# Patient Record
Sex: Female | Born: 1975
Health system: Southern US, Community
[De-identification: ages and names within clinical notes are randomized; demographics above are authoritative.]

## PROBLEM LIST (undated history)

## (undated) ENCOUNTER — Inpatient Hospital Stay: Payer: Self-pay

---

## 2014-01-18 ENCOUNTER — Inpatient Hospital Stay: Payer: Self-pay | Admitting: Obstetrics and Gynecology

## 2014-01-18 LAB — COMPREHENSIVE METABOLIC PANEL
ANION GAP: 6 — AB (ref 7–16)
Albumin: 2.2 g/dL — ABNORMAL LOW (ref 3.4–5.0)
Alkaline Phosphatase: 217 U/L — ABNORMAL HIGH
BUN: 4 mg/dL — ABNORMAL LOW (ref 7–18)
Bilirubin,Total: 0.5 mg/dL (ref 0.2–1.0)
CHLORIDE: 110 mmol/L — AB (ref 98–107)
CREATININE: 0.68 mg/dL (ref 0.60–1.30)
Calcium, Total: 7.7 mg/dL — ABNORMAL LOW (ref 8.5–10.1)
Co2: 25 mmol/L (ref 21–32)
EGFR (Non-African Amer.): >60
Glucose: 70 mg/dL (ref 65–99)
Osmolality: 277 (ref 275–301)
POTASSIUM: 3.2 mmol/L — AB (ref 3.5–5.1)
SGOT(AST): 26 U/L (ref 15–37)
SGPT (ALT): 15 U/L
SODIUM: 141 mmol/L (ref 136–145)
TOTAL PROTEIN: 5.9 g/dL — AB (ref 6.4–8.2)

## 2014-01-18 LAB — CBC WITH DIFFERENTIAL/PLATELET
Basophil #: 0.1 10*3/uL (ref 0.0–0.1)
Basophil %: 0.6 %
Eosinophil #: 0 10*3/uL (ref 0.0–0.7)
Eosinophil %: 0.4 %
HCT: 30.4 % — ABNORMAL LOW (ref 35.0–47.0)
HGB: 9.9 g/dL — ABNORMAL LOW (ref 12.0–16.0)
LYMPHS ABS: 2 10*3/uL (ref 1.0–3.6)
Lymphocyte %: 18.6 %
MCH: 27 pg (ref 26.0–34.0)
MCHC: 32.4 g/dL (ref 32.0–36.0)
MCV: 83 fL (ref 80–100)
Monocyte #: 0.5 x10 3/mm (ref 0.2–0.9)
Monocyte %: 4.8 %
NEUTROS ABS: 8 10*3/uL — AB (ref 1.4–6.5)
Neutrophil %: 75.6 %
Platelet: 185 10*3/uL (ref 150–440)
RBC: 3.65 10*6/uL — ABNORMAL LOW (ref 3.80–5.20)
RDW: 14.6 % — ABNORMAL HIGH (ref 11.5–14.5)
WBC: 10.6 10*3/uL (ref 3.6–11.0)

## 2014-01-19 LAB — CBC WITH DIFFERENTIAL/PLATELET
Basophil #: 0.1 10*3/uL (ref 0.0–0.1)
Basophil %: 0.6 %
EOS ABS: 0.1 10*3/uL (ref 0.0–0.7)
Eosinophil %: 0.7 %
HCT: 31.1 % — ABNORMAL LOW (ref 35.0–47.0)
HGB: 9.9 g/dL — ABNORMAL LOW (ref 12.0–16.0)
Lymphocyte #: 1.8 10*3/uL (ref 1.0–3.6)
Lymphocyte %: 13.3 %
MCH: 26.8 pg (ref 26.0–34.0)
MCHC: 32 g/dL (ref 32.0–36.0)
MCV: 84 fL (ref 80–100)
Monocyte #: 0.8 x10 3/mm (ref 0.2–0.9)
Monocyte %: 5.7 %
NEUTROS ABS: 10.7 10*3/uL — AB (ref 1.4–6.5)
NEUTROS PCT: 79.7 %
Platelet: 172 10*3/uL (ref 150–440)
RBC: 3.71 10*6/uL — AB (ref 3.80–5.20)
RDW: 14.3 % (ref 11.5–14.5)
WBC: 13.4 10*3/uL — AB (ref 3.6–11.0)

## 2014-01-19 LAB — COMPREHENSIVE METABOLIC PANEL
ALBUMIN: 2 g/dL — AB (ref 3.4–5.0)
ALK PHOS: 181 U/L — AB
ALT: 19 U/L
Anion Gap: 6 — ABNORMAL LOW (ref 7–16)
BILIRUBIN TOTAL: 0.4 mg/dL (ref 0.2–1.0)
BUN: 2 mg/dL — AB (ref 7–18)
CO2: 25 mmol/L (ref 21–32)
Calcium, Total: 7.6 mg/dL — ABNORMAL LOW (ref 8.5–10.1)
Chloride: 110 mmol/L — ABNORMAL HIGH (ref 98–107)
Creatinine: 0.58 mg/dL — ABNORMAL LOW (ref 0.60–1.30)
GLUCOSE: 83 mg/dL (ref 65–99)
Osmolality: 277 (ref 275–301)
Potassium: 3.7 mmol/L (ref 3.5–5.1)
SGOT(AST): 33 U/L (ref 15–37)
Sodium: 141 mmol/L (ref 136–145)
Total Protein: 5.6 g/dL — ABNORMAL LOW (ref 6.4–8.2)

## 2014-01-19 LAB — HEMATOCRIT: HCT: 28.4 % — ABNORMAL LOW (ref 35.0–47.0)

## 2014-07-04 NOTE — H&P (Signed)
L&D Evaluation:  History:  HPI -CC: regular UCs -HPI: 39 y/o [id number] @ 39/1 with above CC. Preg c/b AMA (declined genetic screening except normal anatomy scan), GBS pos, h/o G2 PPH  Minimal VB discharge, no LOF or decreased FM  Pt denies any s/s of pre-x   Allergies NKDA   Exam:  Vital Signs AF mildly elevated BPs (one 170s on admit). all other VS normal and stable most recent BP 140s/ 102    General no apparent distress   Mental Status clear    Chest clear    Heart rrr no mrgs   Abdomen gravid, non-tender, 3500gm, cephalic   Reflexes 2+  brachial    Pelvic 4-5 at noon per RN   Mebranes Intact   FHT category I   Ucx regular, q1-676m   Skin dry   Other 2+ b/l LE edema symmetric   Impression:  Impression active term labor, gHTN vs pre- x w/o severe features   Plan:  Comments *IUP: category I tracing. fetal status reassuring *GBS pos: 1st dose of amp going in now *active term labor[facility] after delivery. type and screen ordered *gHTN: f/u pre-eclampsia labs and pt told to let us know for any severe s/s.   anticipate SVD   Electronic Signatures: [place] BingPickens, Lindsay Fitzgerald (MD)  (Signed 25-Nov-15 13:31)  Authored: L&D Evaluation   Last Updated: 25-Nov-15 13:31 by [place] BingPickens, Destiny Hagin (MD)

## 2015-01-25 LAB — OB RESULTS CONSOLE HEPATITIS B SURFACE ANTIGEN: Hepatitis B Surface Ag: NEGATIVE

## 2015-01-25 LAB — OB RESULTS CONSOLE RPR: RPR: NONREACTIVE

## 2015-01-25 LAB — OB RESULTS CONSOLE HIV ANTIBODY (ROUTINE TESTING): HIV: NONREACTIVE

## 2015-01-25 LAB — OB RESULTS CONSOLE VARICELLA ZOSTER ANTIBODY, IGG: Varicella: IMMUNE

## 2015-01-25 LAB — OB RESULTS CONSOLE RUBELLA ANTIBODY, IGM: Rubella: IMMUNE

## 2015-02-25 NOTE — L&D Delivery Note (Signed)
Delivery Note  EGA 38.6   At 7:09 AM a viable female was delivered via Vaginal, Spontaneous Delivery (Presentation: ROA).  APGAR: 8, 9; weight  .   Placenta status: Intact, Spontaneous.  Cord:  with the following complications: none apparent.  Cord pH: not collected.  Anesthesia: None  Episiotomy: None Lacerations:  None Suture Repair: N/A Est. Blood Loss (mL):  200cc  Mom to postpartum.  Baby to Couplet care / Skin to Skin.  Mom presented with worsening contractions and history of precipitous delivery.  She was observed without cervical change for several hours, given her antibiotics for GBS ppx and then given cytotec x2 for cervical ripening.  At 6am she was checked and 4cm, with stronger contractions.  She quickly progressed to 6-7cm and was AROM'd for clear fluid.  She was complete and began pushing within 15 minutes.  She delivered a viable female without difficulty, which was placed immediately on her chest.  Cord was kept intact until pulseless, and then clamped and cut by FOB. Cord blood was collected. Placenta delivered spontaneously, and intact.  No lacerations. We sang happy birthday to the as-of-yet-unnamed beautiful baby boy.    Jayel Inks C Tee Richeson 08/05/2015, 7:33 AM

## 2015-07-20 LAB — OB RESULTS CONSOLE GBS: STREP GROUP B AG: POSITIVE

## 2015-07-23 ENCOUNTER — Encounter: Payer: Self-pay | Admitting: Obstetrics & Gynecology

## 2015-07-23 ENCOUNTER — Inpatient Hospital Stay
Admission: EM | Admit: 2015-07-23 | Discharge: 2015-07-24 | DRG: 778 | Disposition: A | Discharge status: Home / Self Care | Payer: 59 | Attending: Obstetrics & Gynecology | Admitting: Obstetrics & Gynecology

## 2015-07-23 DIAGNOSIS — O09523 Supervision of elderly multigravida, third trimester: Secondary | ICD-10-CM

## 2015-07-23 DIAGNOSIS — O99820 Streptococcus B carrier state complicating pregnancy: Secondary | ICD-10-CM | POA: Diagnosis present

## 2015-07-23 DIAGNOSIS — Z3A36 36 weeks gestation of pregnancy: Secondary | ICD-10-CM

## 2015-07-23 LAB — TYPE AND SCREEN
ABO/RH(D): O POS
ANTIBODY SCREEN: NEGATIVE

## 2015-07-23 LAB — ABO/RH: ABO/RH(D): O POS

## 2015-07-23 LAB — CBC
HCT: 32.4 % — ABNORMAL LOW (ref 35.0–47.0)
HEMOGLOBIN: 10.7 g/dL — AB (ref 12.0–16.0)
MCH: 27.3 pg (ref 26.0–34.0)
MCHC: 33.1 g/dL (ref 32.0–36.0)
MCV: 82.4 fL (ref 80.0–100.0)
PLATELETS: 215 10*3/uL (ref 150–440)
RBC: 3.93 MIL/uL (ref 3.80–5.20)
RDW: 14.2 % (ref 11.5–14.5)
WBC: 10.7 10*3/uL (ref 3.6–11.0)

## 2015-07-23 MED ORDER — BETAMETHASONE SOD PHOS & ACET 6 (3-3) MG/ML IJ SUSP
12.0000 mg | INTRAMUSCULAR | Status: DC
Start: 2015-07-23 — End: 2015-07-24
  Administered 2015-07-23: 12 mg via INTRAMUSCULAR

## 2015-07-23 MED ORDER — LACTATED RINGERS IV SOLN: 500.0000 mL | INTRAVENOUS | Status: DC | PRN

## 2015-07-23 MED ORDER — DEXTROSE 5 % IV SOLN
5.0000 10*6.[IU] | Freq: Once | INTRAVENOUS | Status: AC
  Administered 2015-07-23: 5 10*6.[IU] via INTRAVENOUS
  Filled 2015-07-23: qty 5

## 2015-07-23 MED ORDER — BETAMETHASONE SOD PHOS & ACET 6 (3-3) MG/ML IJ SUSP
INTRAMUSCULAR | Status: AC
  Administered 2015-07-23: 12 mg via INTRAMUSCULAR
  Filled 2015-07-23: qty 1

## 2015-07-23 MED ORDER — PENICILLIN G POTASSIUM 5000000 UNITS IJ SOLR
2.5000 10*6.[IU] | INTRAVENOUS | Status: DC
  Administered 2015-07-24: 2.5 10*6.[IU] via INTRAVENOUS
  Filled 2015-07-23 (×7): qty 2.5

## 2015-07-23 MED ORDER — LACTATED RINGERS IV SOLN
INTRAVENOUS | Status: DC
  Administered 2015-07-23: 19:00:00 via INTRAVENOUS

## 2015-07-23 MED ORDER — LIDOCAINE HCL (PF) 1 % IJ SOLN
INTRAMUSCULAR | Status: AC
  Filled 2015-07-23: qty 30

## 2015-07-23 MED ORDER — SOD CITRATE-CITRIC ACID 500-334 MG/5ML PO SOLN: 30.0000 mL | ORAL | Status: DC | PRN

## 2015-07-23 MED ORDER — OXYTOCIN 40 UNITS IN LACTATED RINGERS INFUSION - SIMPLE MED: 2.5000 [IU]/h | INTRAVENOUS | Status: DC

## 2015-07-23 MED ORDER — BUTORPHANOL TARTRATE 1 MG/ML IJ SOLN: 1.0000 mg | INTRAMUSCULAR | Status: DC | PRN

## 2015-07-23 MED ORDER — PROCHLORPERAZINE EDISYLATE 5 MG/ML IJ SOLN
10.0000 mg | Freq: Once | INTRAMUSCULAR | Status: AC
  Administered 2015-07-23: 10 mg via INTRAVENOUS
  Filled 2015-07-23 (×2): qty 2

## 2015-07-23 MED ORDER — MORPHINE SULFATE (PF) 10 MG/ML IV SOLN
10.0000 mg | Freq: Once | INTRAVENOUS | Status: AC
  Administered 2015-07-23: 10 mg via INTRAVENOUS
  Filled 2015-07-23: qty 1

## 2015-07-23 MED ORDER — ONDANSETRON HCL 4 MG/2ML IJ SOLN: 4.0000 mg | Freq: Four times a day (QID) | INTRAMUSCULAR | Status: DC | PRN

## 2015-07-23 MED ORDER — ACETAMINOPHEN 325 MG PO TABS: 650.0000 mg | ORAL_TABLET | ORAL | Status: DC | PRN

## 2015-07-23 MED ORDER — OXYTOCIN 10 UNIT/ML IJ SOLN
INTRAMUSCULAR | Status: AC
  Filled 2015-07-23: qty 2

## 2015-07-23 MED ORDER — OXYTOCIN BOLUS FROM INFUSION: 500.0000 mL | INTRAVENOUS | Status: DC

## 2015-07-23 MED ORDER — LIDOCAINE HCL (PF) 1 % IJ SOLN: 30.0000 mL | INTRAMUSCULAR | Status: DC | PRN

## 2015-07-23 MED ORDER — AMMONIA AROMATIC IN INHA
RESPIRATORY_TRACT | Status: AC
  Filled 2015-07-23: qty 10

## 2015-07-23 MED ORDER — MISOPROSTOL 200 MCG PO TABS
ORAL_TABLET | ORAL | Status: AC
  Filled 2015-07-23: qty 4

## 2015-07-23 NOTE — Plan of Care (Signed)
Discussed with pt that we had spoken to dr ward. Ambulation encouraged. Portable monitors applied. Unable to determine presenting part on cervical exam.

## 2015-07-23 NOTE — Plan of Care (Signed)
Dr ward here. Presentation verified. Orders received

## 2015-07-23 NOTE — Progress Notes (Signed)
Intrapartum progress note  Patient is still having painful contractions, becoming stronger.  She is nervous about going home due to distance and the fact that her other labors felt as she does now, and she went from first contraction to delivery in 2 hours (at 39 weeks) with no pain meds. She has no other complaints.  O: BP 134/77 mmHg  Pulse 69  Temp(Src) 97.7 F (36.5 C) (Oral)  Resp 18  Ht 4\' 11"  (1.499 m)  Wt 56.7 kg (125 lb)  BMI 25.23 kg/m2  SpO2 98%  FHT: 115 mod + accels no decels TOCO: q2-596min SVE: unchanged, 3/50/-3  A/P: 40yo [id number] @ 37.0 with early (prodromal) labor:  1. IUP category 1 2. Labor:  Early stage, but understand patient's anxiety about being discharged.  She is visibly uncomfortable through contractions.  I think it is reasonable to give a morphine rest and see what happens overnight.  She would perhaps be more comfortable going home in the morning, or have made change during the night.  Due to her being early term I am not eager to assist her labor along.  10mg  morphine/10mg  compazine (phenergan on national shortage) 3. GBS: continue PCN as scheduled. 4. Expectant management.   ----- Ranae Plumberhelsea Keondre Markson, MD Attending Obstetrician and Gynecologist [facility]

## 2015-07-23 NOTE — Plan of Care (Signed)
[id number] complaint of irregular uc's since last night and some since last two weeks. Denies leaking fluid. Active fetal movement. Pt states she has history of rapid labors and delivering quickly so she was concerned enough to come in for evaluation. Pt had last appt Friday with Dr. Tiburcio PeaHarris and was 1 cm. GBS test just performed Friday so no results, however, pt states she was positive with last babies.  Lindsay Fitzgerald RNC

## 2015-07-23 NOTE — H&P (Signed)
OB History & Physical   History of Present Illness:  Chief Complaint: contractions  HPI:  Lindsay Fitzgerald is a 40 y.o. [id number] @ 37.0 at Unknown dated by 12 week ultrasound.  She presents to L&D for concern about contractions, and history of precipitous deliveries.  She began having contractions earlier in the day and they became stronger and more frequent over time.   +FM, +TX, no LOF, no VB  Pregnancy Issues: 1. AMA (age 40 @ delivery) 2. History of postpartum HTN 3. History of precipitous deliveries   Maternal Medical History:  No past medical history on file.  No past surgical history on file.  Allergies not on file  Prior to Admission medications   Not on File     Prenatal care site: [facility]  Social History: She  denies smoking, drinking alcohol, illicit drug use  Family History: family history is not on file.   Review of Systems: Negative x 10 systems reviewed except as noted in the HPI.     Physical Exam:  Vital Signs: BP 109/76 mmHg  Pulse 80 General: no acute distress.  HEENT: normocephalic, atraumatic Heart: regular rate & rhythm.  No murmurs/rubs/gallops Lungs: clear to auscultation bilaterally, normal respiratory effort Abdomen: soft, gravid, non-tender;  EFW: 6.12 Pelvic:   External: Normal external female genitalia  Cervix: Dilation: 3 / Effacement (%): 50 /   -3   Extremities: non-tender, symmetric, 1+ edema bilaterally.  DTRs: 2+ Neurologic: Alert & oriented x 3.    No results found for this or any previous visit (from the past 24 hour(s)).  Pertinent Results:  Prenatal Labs: Blood type/Rh O+  Antibody screen neg  Rubella Immune  Varicella Immune  RPR NR  HBsAg Neg  HIV NR  GC neg  Chlamydia neg  Genetic screening negative  1 hour GTT 64  3 hour GTT   GBS Positive 07/20/15   FHT: 115 mod + accels no decels TOCO: q2-4 mins SVE: 3/50/-3 soft, anterior   Cephalic by ultrasound  Assessment:  Lindsay Fitzgerald is a 40 y.o. [id number] @ 37.0  with labor   Plan:  1. Admit to Labor & Delivery 2. CBC, T&S, Clrs, IVF 3. GBS  Pos - PCN started 4. Consents obtained. 5. Continuous efm/toco 6. Due to uncertain dates, patient said she was 36+ weeks, BMZ was given and tocolysis declined.  After review of prenatals, patient is 37.0 thus further BMZ is not indicated.  Will continue expectant management.    ----- Ranae Plumberhelsea Clemente Dewey, MD Attending Obstetrician and Gynecologist [facility]

## 2015-07-23 NOTE — Progress Notes (Signed)
Received page at 5pm from nurse concerned that presenting part could not be palpated upon exam, and that patient was still fingertip-1cm.  Arrived to birthplace with patient on monitor and breathing through contractions.   Ultrasound performed and confirmed cephalic presentation via ultrasound, with fetal eyes facing patient's left (ROA).  Cervical exam was 3cm, 50%, -3, soft and anterior.    Offered patient the following options: 1. Observation and expectant management 2. Betamethasone with observation and expectant management 3. Betamethasone with tocolysis 4. Tocolysis without betamethasone  Risks and benefits of each were covered in detail, as well as risk of preterm infant.  She is 36 weeks.  Patient elects to receive betamethasone injections with expectant management; she feels strongly that if her body is in labor she should not stop it.  Will administer BMZ either q24h if delivery is not imminent or if contractions space out, or q12h if labor progresses and delivery appears imminent prior to 24h.  Patient has the understanding that if her contractions space out, and she does not make progressive cervical change, she may be discharged home.    H&P to follow.  ----- Ranae Plumberhelsea Ward, MD Attending Obstetrician and Gynecologist [facility]

## 2015-07-23 NOTE — Progress Notes (Signed)
Pt became pale and unresponsive after starting iv. Dr ward notified. Head of bed lowered. Pulse ox applied. Iv fluid bolus given.

## 2015-07-24 NOTE — Discharge Summary (Signed)
Antenatal Physician Discharge Summary  Patient ID: Lindsay Fitzgerald MRN: [id number] DOB/AGE: 40/06/1975 40 y.o.  Admit date: 07/23/2015 Discharge date: 07/24/2015  Admission Diagnoses: early labor, history of precipitous labor  Discharge Diagnoses: same  Prenatal Procedures: NST, BMZ x1  Consults: none  Significant Diagnostic Studies:  Results for orders placed or performed during the hospital encounter of 07/23/15 (from the past 168 hour(s))  OB RESULT CONSOLE Group B Strep   Collection Time: 07/20/15 12:00 AM  Result Value Ref Range   GBS Positive   CBC   Collection Time: 07/23/15  6:25 PM  Result Value Ref Range   WBC 10.7 3.6 - 11.0 K/uL   RBC 3.93 3.80 - 5.20 MIL/uL   Hemoglobin 10.7 (L) 12.0 - 16.0 g/dL   HCT 40.932.4 (L) 81.135.0 - 91.447.0 %   MCV 82.4 80.0 - 100.0 fL   MCH 27.3 26.0 - 34.0 pg   MCHC 33.1 32.0 - 36.0 g/dL   RDW 78.214.2 95.611.5 - 21.314.5 %   Platelets 215 150 - 440 K/uL  Type and screen [facility]   Collection Time: 07/23/15  6:25 PM  Result Value Ref Range   ABO/RH(D) O POS    Antibody Screen NEG    Sample Expiration 07/26/2015   ABO/Rh   Collection Time: 07/23/15  6:29 PM  Result Value Ref Range   ABO/RH(D) O POS     Treatments: ultrasound, BMZ x1, morphine rest  Hospital Course:  This is a 40 y.o. [id number] with IUP at 5045w1d admitted for labor with history of precipitous delivery.  Patient had contractions, and per her history of contraction onset to delivery in 2hrs, reported to the hospital.  +Fetal movement, No leaking of fluid and no bleeding.  She was initially thought to be 36weeks so she was offered tocolysis and betamethasone.  She declined tocolysis but accepted BMZ.  It was later determined that she was 37.0 weeks.   When she initially presented she was reported to be 1cm, and on my exam a few hours later was 3cm.  She was observed, fetal heart rate monitoring remained reassuring, and she had no signs/symptoms of progressing preterm labor or  other maternal-fetal concerns. After several hours, her cervical exam was unchanged from 3cm.  She was concerned because her contractions were strong, and she was worried about going home due to her history. She was given morphine rest, but eventually elected to be discharged home. She was deemed stable for discharge to home with outpatient follow up.  Discharge Physical Exam: BP 112/92 mmHg  Pulse 111  Temp(Src) 98 F (36.7 C) (Oral)  Resp 18  Ht 4\' 11"  (1.499 m)  Wt 56.7 kg (125 lb)  BMI 25.23 kg/m2  SpO2 98%  General: NAD CV: RRR Pulm: CTABL, nl effort ABD: s/nd/nt, gravid DVT Evaluation: LE non-ttp, no evidence of DVT on exam.  SVE:  115 mod + accels no decels FHT  3/50/-3 TOCO:  q2-8 mins   Discharge Condition: Stable  Disposition: 01-Home or Self Care     Medication List    Notice    You have not been prescribed any medications.         Follow-up Information    Please follow up.   Why:  please keep scheduled follow up appointment      Signed: ----- Ranae Plumberhelsea Jawuan Robb, MD Attending Obstetrician and Gynecologist [facility]

## 2015-07-24 NOTE — Discharge Summary (Signed)
Patient discharged with instructions on labor precautions, follow up appointment, and when to seek medical attention. Patient ambulatory at discharge with steady gait, but wheeled down with husband. Patient denies any pain at the time.

## 2015-07-25 LAB — RPR: RPR: NONREACTIVE

## 2015-08-04 ENCOUNTER — Inpatient Hospital Stay
Admission: EM | Admit: 2015-08-04 | Discharge: 2015-08-07 | DRG: 775 | Disposition: A | Discharge status: Home / Self Care | Payer: 59 | Attending: Obstetrics & Gynecology | Admitting: Obstetrics & Gynecology

## 2015-08-04 ENCOUNTER — Encounter: Payer: Self-pay | Admitting: *Deleted

## 2015-08-04 DIAGNOSIS — O99013 Anemia complicating pregnancy, third trimester: Secondary | ICD-10-CM | POA: Diagnosis present

## 2015-08-04 DIAGNOSIS — Z3A38 38 weeks gestation of pregnancy: Secondary | ICD-10-CM | POA: Diagnosis not present

## 2015-08-04 DIAGNOSIS — O9902 Anemia complicating childbirth: Secondary | ICD-10-CM | POA: Diagnosis present

## 2015-08-04 DIAGNOSIS — D62 Acute posthemorrhagic anemia: Secondary | ICD-10-CM | POA: Diagnosis not present

## 2015-08-04 DIAGNOSIS — Z8759 Personal history of other complications of pregnancy, childbirth and the puerperium: Secondary | ICD-10-CM

## 2015-08-04 DIAGNOSIS — O99824 Streptococcus B carrier state complicating childbirth: Principal | ICD-10-CM | POA: Diagnosis present

## 2015-08-04 LAB — CBC
HEMATOCRIT: 29.9 % — AB (ref 35.0–47.0)
HEMOGLOBIN: 9.8 g/dL — AB (ref 12.0–16.0)
MCH: 26.9 pg (ref 26.0–34.0)
MCHC: 32.9 g/dL (ref 32.0–36.0)
MCV: 81.9 fL (ref 80.0–100.0)
Platelets: 194 10*3/uL (ref 150–440)
RBC: 3.66 MIL/uL — ABNORMAL LOW (ref 3.80–5.20)
RDW: 14.7 % — ABNORMAL HIGH (ref 11.5–14.5)
WBC: 10.4 10*3/uL (ref 3.6–11.0)

## 2015-08-04 LAB — TYPE AND SCREEN
ABO/RH(D): O POS
ANTIBODY SCREEN: NEGATIVE

## 2015-08-04 MED ORDER — MISOPROSTOL 25 MCG QUARTER TABLET
25.0000 ug | ORAL_TABLET | ORAL | Status: DC | PRN
  Administered 2015-08-04 – 2015-08-05 (×2): 25 ug via ORAL
  Filled 2015-08-04 (×3): qty 1

## 2015-08-04 MED ORDER — OXYTOCIN 40 UNITS IN LACTATED RINGERS INFUSION - SIMPLE MED
2.5000 [IU]/h | INTRAVENOUS | Status: DC
  Filled 2015-08-04: qty 1000

## 2015-08-04 MED ORDER — LACTATED RINGERS IV SOLN
INTRAVENOUS | Status: DC
  Administered 2015-08-04: 21:00:00 via INTRAVENOUS

## 2015-08-04 MED ORDER — LACTATED RINGERS IV SOLN: 500.0000 mL | INTRAVENOUS | Status: DC | PRN

## 2015-08-04 MED ORDER — PENICILLIN G POTASSIUM 5000000 UNITS IJ SOLR
5.0000 10*6.[IU] | Freq: Once | INTRAVENOUS | Status: AC
  Administered 2015-08-04: 5 10*6.[IU] via INTRAVENOUS
  Filled 2015-08-04: qty 5

## 2015-08-04 MED ORDER — HYDROXYZINE HCL 25 MG PO TABS
25.0000 mg | ORAL_TABLET | Freq: Three times a day (TID) | ORAL | Status: DC | PRN
  Administered 2015-08-04: 25 mg via ORAL
  Filled 2015-08-04 (×2): qty 1

## 2015-08-04 MED ORDER — OXYTOCIN 40 UNITS IN LACTATED RINGERS INFUSION - SIMPLE MED: 1.0000 m[IU]/min | INTRAVENOUS | Status: DC

## 2015-08-04 MED ORDER — LIDOCAINE HCL (PF) 1 % IJ SOLN: 30.0000 mL | INTRAMUSCULAR | Status: DC | PRN

## 2015-08-04 MED ORDER — SODIUM CHLORIDE FLUSH 0.9 % IV SOLN
INTRAVENOUS | Status: AC
Start: 2015-08-04 — End: 2015-08-05
  Filled 2015-08-04: qty 10

## 2015-08-04 MED ORDER — TERBUTALINE SULFATE 1 MG/ML IJ SOLN
0.2500 mg | Freq: Once | INTRAMUSCULAR | Status: DC | PRN
  Filled 2015-08-04: qty 1

## 2015-08-04 MED ORDER — ACETAMINOPHEN 325 MG PO TABS: 650.0000 mg | ORAL_TABLET | ORAL | Status: DC | PRN

## 2015-08-04 MED ORDER — OXYTOCIN BOLUS FROM INFUSION: 500.0000 mL | INTRAVENOUS | Status: DC

## 2015-08-04 MED ORDER — PENICILLIN G POTASSIUM 5000000 UNITS IJ SOLR
2.5000 10*6.[IU] | INTRAVENOUS | Status: DC
  Administered 2015-08-05 (×2): 2.5 10*6.[IU] via INTRAVENOUS
  Filled 2015-08-04 (×11): qty 2.5

## 2015-08-04 MED ORDER — SOD CITRATE-CITRIC ACID 500-334 MG/5ML PO SOLN: 30.0000 mL | ORAL | Status: DC | PRN

## 2015-08-04 MED ORDER — ONDANSETRON HCL 4 MG/2ML IJ SOLN: 4.0000 mg | Freq: Four times a day (QID) | INTRAMUSCULAR | Status: DC | PRN

## 2015-08-04 NOTE — H&P (Signed)
Expand All Collapse All   OB History & Physical   History of Present Illness:  Chief Complaint: contractions  HPI:  Lindsay Fitzgerald is a 40 y.o. [id number] @ 38.5 dated by 12 week ultrasound. She presents to L&D for concern about contractions, and history of precipitous deliveries. She began having contractions earlier in the day and they became stronger and more frequent over time.   She was previously admitted to L&D with concern for preterm labor and received BMZ x1 before her dates were calculated.  She was 37.0 at that time.  She was observed, given morphine rest which was unsuccessful in relieving her pain, but ultimately she decided to return home to await labor.  She has been contracting regularly since her admission, but has had no cervical change.  Today, she reports that she had had an increase in frequency and in intensity, and again is worried about precipitous delivery.  She lives over an hour away from our hospital.  She has been receiving antenatal testing as an outpatient due to age =40, and was to be scheduled for elective IOL at 39.0 weeks due to her history of precip deliveries.    +FM, +TX, no LOF, no VB  Pregnancy Issues: 1. AMA (age 39 @ delivery) 2. History of postpartum HTN 3. History of precipitous deliveries 4. GBS positive  Maternal Medical History:  No past medical history   No past surgical history.  Allergies: none  Prior to Admission medications   No meds     Prenatal care site: [facility]  Social History: She  denies smoking, drinking alcohol, illicit drug use  Family History: none reported   Review of Systems: Negative x 10 systems reviewed except as noted in the HPI.    Physical Exam:  Vital Signs: BP 109/76 mmHg  Pulse 80 General: no acute distress.  HEENT: normocephalic, atraumatic Heart: regular rate & rhythm. No murmurs/rubs/gallops Lungs: clear to auscultation bilaterally, normal respiratory effort Abdomen: soft,  gravid, non-tender; EFW: 7.5 Pelvic:  External: Normal external female genitalia Cervix: Dilation: 3 / Effacement (%): 50 /   -1  Extremities: non-tender, symmetric, 1+ edema bilaterally. DTRs: 2+ Neurologic: Alert & oriented x 3.    Lab Results Last 24 Hours    No results found for this or any previous visit (from the past 24 hour(s)).    Pertinent Results:  Prenatal Labs: Blood type/Rh O+  Antibody screen neg  Rubella Immune  Varicella Immune  RPR NR  HBsAg Neg  HIV NR  GC neg  Chlamydia neg  Genetic screening negative  1 hour GTT 64  3 hour GTT   GBS Positive 07/20/15   FHT: 115 mod + accels no decels TOCO: q2-5 mins SVE: 3/50/-1 soft, posterior  Cephalic by leopolds  Assessment:  Lindsay Fitzgerald is a 40 y.o. [id number] @ 38.5 with contractions and concern for labor / early labor  Plan: 1.  Admit to Labor & Delivery 2. CBC, T&S, Clrs, IVF 3. GBS Pos - start PCN 4. Consents obtained. 5. Continuous efm/toco 6. Ideally, she would make it to 39.0 for induction.  However, she is 38.5, dated by a 12wk ultrasound, is s/p BMZ x1, has had an acute change with symptomatic contractions which have been plaguing her for weeks, and has a history of rapid deliveries once in labor, I find it reasonable to admit her for expectant management and/or labor augmentation. She is fatigued and has high anxiety about not knowing what her contractions will be like  when it's time - the contractions she is experiencing now are as strong as when she was in labor with her previous pregnancies.  On the monitor she has runs of frequent contractions and then they space out.  We'll see how she does in the next few hours while her antibiotics are running.  Will consider cytotec vs pitocin vs AROM once we get to that point.  ----- Ranae Plumberhelsea Joleena Weisenburger, MD Attending Obstetrician and Gynecologist [facility]

## 2015-08-05 DIAGNOSIS — O99013 Anemia complicating pregnancy, third trimester: Secondary | ICD-10-CM | POA: Diagnosis present

## 2015-08-05 DIAGNOSIS — Z8759 Personal history of other complications of pregnancy, childbirth and the puerperium: Secondary | ICD-10-CM

## 2015-08-05 MED ORDER — ONDANSETRON HCL 4 MG PO TABS: 4.0000 mg | ORAL_TABLET | ORAL | Status: DC | PRN

## 2015-08-05 MED ORDER — OXYCODONE HCL 5 MG PO TABS
5.0000 mg | ORAL_TABLET | ORAL | Status: DC | PRN
  Administered 2015-08-05 – 2015-08-07 (×13): 5 mg via ORAL
  Filled 2015-08-05 (×12): qty 1

## 2015-08-05 MED ORDER — DIPHENHYDRAMINE HCL 25 MG PO CAPS
25.0000 mg | ORAL_CAPSULE | Freq: Four times a day (QID) | ORAL | Status: DC | PRN
Start: 2015-08-05 — End: 2015-08-07

## 2015-08-05 MED ORDER — OXYCODONE HCL 5 MG PO TABS
ORAL_TABLET | ORAL | Status: AC
  Filled 2015-08-05: qty 1

## 2015-08-05 MED ORDER — LIDOCAINE HCL (PF) 1 % IJ SOLN
INTRAMUSCULAR | Status: AC
  Filled 2015-08-05: qty 30

## 2015-08-05 MED ORDER — DIBUCAINE 1 % RE OINT: 1.0000 "application " | TOPICAL_OINTMENT | RECTAL | Status: DC | PRN

## 2015-08-05 MED ORDER — IBUPROFEN 600 MG PO TABS
600.0000 mg | ORAL_TABLET | Freq: Four times a day (QID) | ORAL | Status: DC
  Administered 2015-08-05 – 2015-08-07 (×9): 600 mg via ORAL
  Filled 2015-08-05 (×8): qty 1

## 2015-08-05 MED ORDER — SIMETHICONE 80 MG PO CHEW: 80.0000 mg | CHEWABLE_TABLET | ORAL | Status: DC | PRN

## 2015-08-05 MED ORDER — DOCUSATE SODIUM 100 MG PO CAPS
100.0000 mg | ORAL_CAPSULE | Freq: Two times a day (BID) | ORAL | Status: DC
  Administered 2015-08-05 – 2015-08-07 (×4): 100 mg via ORAL
  Filled 2015-08-05 (×4): qty 1

## 2015-08-05 MED ORDER — IBUPROFEN 600 MG PO TABS
ORAL_TABLET | ORAL | Status: AC
  Administered 2015-08-05: 600 mg via ORAL
  Filled 2015-08-05: qty 1

## 2015-08-05 MED ORDER — ACETAMINOPHEN 500 MG PO TABS: 1000.0000 mg | ORAL_TABLET | Freq: Four times a day (QID) | ORAL | Status: DC | PRN

## 2015-08-05 MED ORDER — AMMONIA AROMATIC IN INHA
RESPIRATORY_TRACT | Status: AC
  Filled 2015-08-05: qty 10

## 2015-08-05 MED ORDER — ONDANSETRON HCL 4 MG/2ML IJ SOLN: 4.0000 mg | INTRAMUSCULAR | Status: DC | PRN

## 2015-08-05 MED ORDER — WITCH HAZEL-GLYCERIN EX PADS: 1.0000 "application " | MEDICATED_PAD | CUTANEOUS | Status: DC | PRN

## 2015-08-05 MED ORDER — MISOPROSTOL 200 MCG PO TABS
ORAL_TABLET | ORAL | Status: AC
  Filled 2015-08-05: qty 4

## 2015-08-05 MED ORDER — COCONUT OIL OIL: 1.0000 "application " | TOPICAL_OIL | Status: DC | PRN

## 2015-08-05 MED ORDER — PRENATAL MULTIVITAMIN CH
1.0000 | ORAL_TABLET | Freq: Every day | ORAL | Status: DC
  Administered 2015-08-05 – 2015-08-06 (×2): 1 via ORAL
  Filled 2015-08-05 (×3): qty 1

## 2015-08-05 MED ORDER — ACETAMINOPHEN 325 MG PO TABS
650.0000 mg | ORAL_TABLET | ORAL | Status: DC | PRN
Start: 2015-08-05 — End: 2015-08-07

## 2015-08-05 MED ORDER — OXYTOCIN 10 UNIT/ML IJ SOLN
INTRAMUSCULAR | Status: AC
  Administered 2015-08-05: 10 [IU] via INTRAMUSCULAR
  Filled 2015-08-05: qty 2

## 2015-08-05 NOTE — Progress Notes (Addendum)
L&D Progress Note  S: Contractions still inconsistent in strength and duration after her first dose of Cytotec, but overall are getting stronger  O: BP 99/66 mmHg  Pulse 71  Temp(Src) 97.7 F (36.5 C) (Oral)  Resp 18  General: breathing thru some contractions FHR: 120 with accelerations to 140 and moderate variability Toco: 2-3/10 minutes, inconsistent strength and duration Cervix: 3.5/80%/-1 Received her second dose of penicillin  A: Starting to make cervical change Reassuring FHR tracing  P: second dose of Cytotec given po Continue to monitor progress and maternal fetal well being.  Nai Dasch, CNM   Just after second Cytotec given, there was a four minute deceleration to 60 BPM, with return to baseline with turning to right side. Was having prolonged contraction or run of contractions at the time. Contraction resolved without terbutaline.  Farrel ConnersGUTIERREZ, Ivyanna Sibert, CNM

## 2015-08-05 NOTE — Discharge Summary (Signed)
Obstetrical Discharge Summary  Patient Name: Lindsay Fitzgerald DOB: 09/10/1975 MRN: [id number]  Date of Admission: 08/04/2015 Date of Discharge: 08/07/2015 Primary OB: [facility]   Gestational Age at Delivery: 5481w6d   Antepartum complications: anemia Admitting Diagnosis: history of precipitous labor, early labor Secondary Diagnosis: Patient Active Problem List   Diagnosis Date Noted  . Anemia affecting pregnancy in third trimester 08/05/2015  . History of precipitous labor and delivery 08/05/2015  . Postpartum care following vaginal delivery 07/23/2015    Augmentation: AROM and Cytotec Complications: None Intrapartum complications/course: patient was admitted in early labor, was given PCN for GBS ppx, cytotec x2, and delivered within 1 hour of active labor. Date of Delivery: 08/05/15 Delivered By: Leeroy Bockhelsea Ward Delivery Type: spontaneous vaginal delivery Anesthesia: none Placenta: sponatneous Laceration: none Episiotomy: none Newborn Data: Live born female  Birth Weight: 6#15.1oz APGAR: 8, 9    Discharge Physical Exam:  BP 113/78 mmHg  Pulse 60  Temp(Src) 97.7 F (36.5 C) (Oral)  Resp 20  SpO2 100%  Breastfeeding? Unknown  General: NAD ABD: s/nd/nt, fundus firm and 1FB below the umbilicus Lochia: appropriate DVT Evaluation: LE non-ttp, no evidence of DVT on exam.  HEMOGLOBIN  Date Value Ref Range Status  08/06/2015 9.6* 12.0 - 16.0 g/dL Final   HGB  Date Value Ref Range Status  01/19/2014 9.9* 12.0-16.0 g/dL Final   HCT  Date Value Ref Range Status  08/06/2015 29.6* 35.0 - 47.0 % Final  01/19/2014 31.1* 35.0-47.0 % Final    Post partum course: benign postpartum course. Discharged home after meeting discharge goals: tolerating diet, voiding without difficulty, cramping managed with oral analgesics, ambulating without difficulty, and caring for baby appropriately. Postpartum Procedures: none Disposition: stable, discharge to home. Baby Feeding: breastmilk Baby  Disposition: home with mom  Rh Immune globulin given: no Rubella vaccine given: n/a Tdap vaccine given in AP or PP setting: AP Flu vaccine given in AP or PP setting: n/a  Contraception: vasectomy  Prenatal Labs:  Blood type/Rh O+  Antibody screen neg  Rubella Immune  Varicella Immune  RPR NR  HBsAg Neg  HIV NR  GC neg  Chlamydia neg  Genetic screening negative  1 hour GTT 64  3 hour GTT   GBS Positive 07/20/15              Plan:  Kanijah Clock was discharged to home in good condition. [facility] Elesa MassedWard in 6 weeks   Discharge Medications:   Medication List    TAKE these medications        Ferrous Fumarate-Folic Acid 324-1 MG Tabs  Take 1 tablet by mouth daily.     ibuprofen 600 MG tablet  Commonly known as:  ADVIL,MOTRIN  Take 1 tablet (600 mg total) by mouth every 6 (six) hours as needed for mild pain, moderate pain or cramping.     oxyCODONE 5 MG immediate release tablet  Commonly known as:  Oxy IR/ROXICODONE  Take 1 tablet (5 mg total) by mouth every 6 (six) hours as needed for moderate pain or severe pain.     prenatal multivitamin Tabs tablet  Take 1 tablet by mouth daily at 12 noon.        Follow-up Information    Schedule an appointment as soon as possible for a visit with Leola Brazilhelsea C Ward, MD.   Specialty:  Obstetrics and Gynecology   Why:  6 week postpartum check   Contact information:   [address] [place]  [address] [phone]       Signed: Farrel Conners, CNM

## 2015-08-06 LAB — CBC
HCT: 29.6 % — ABNORMAL LOW (ref 35.0–47.0)
Hemoglobin: 9.6 g/dL — ABNORMAL LOW (ref 12.0–16.0)
MCH: 26.9 pg (ref 26.0–34.0)
MCHC: 32.5 g/dL (ref 32.0–36.0)
MCV: 82.7 fL (ref 80.0–100.0)
Platelets: 193 10*3/uL (ref 150–440)
RBC: 3.58 MIL/uL — AB (ref 3.80–5.20)
RDW: 14.9 % — ABNORMAL HIGH (ref 11.5–14.5)
WBC: 12.9 10*3/uL — AB (ref 3.6–11.0)

## 2015-08-06 LAB — RPR: RPR Ser Ql: NONREACTIVE

## 2015-08-06 NOTE — Progress Notes (Signed)
Admit Date: 08/04/2015 Today's Date: 08/06/2015  Post Partum Day 1  Subjective:  no complaints, up ad lib, voiding and tolerating PO  Objective: Temp:  [97.7 F (36.5 C)-98.5 F (36.9 C)] 97.7 F (36.5 C) (06/12 0806) Pulse Rate:  [55-91] 91 (06/12 0806) Resp:  [14-20] 20 (06/12 0806) BP: (114-157)/(71-87) 119/87 mmHg (06/12 0806) SpO2:  [99 %-100 %] 99 % (06/12 0806)  Physical Exam:  General: alert, cooperative and no distress Lochia: appropriate Uterine Fundus: firm Incision: none DVT Evaluation: No evidence of DVT seen on physical exam.   Recent Labs  08/04/15 2220 08/06/15 0353  HGB 9.8* 9.6*  HCT 29.9* 29.6*    Assessment/Plan: Plan for discharge tomorrow, Breastfeeding and Infant doing well  Anemia, iron pills and PNV [facility], discussed all options   LOS: 2 days   Letitia Libraobert Paul Aaliyha Mumford [facility] 08/06/2015, 9:46 AM

## 2015-08-07 MED ORDER — OXYCODONE HCL 5 MG PO TABS: 5.0000 mg | ORAL_TABLET | Freq: Four times a day (QID) | ORAL | Status: DC | PRN

## 2015-08-07 MED ORDER — IBUPROFEN 600 MG PO TABS: 600.0000 mg | ORAL_TABLET | Freq: Four times a day (QID) | ORAL | Status: DC | PRN

## 2015-08-07 MED ORDER — FERROUS FUMARATE-FOLIC ACID 324-1 MG PO TABS: 1.0000 | ORAL_TABLET | Freq: Every day | ORAL | Status: AC

## 2015-08-07 MED ORDER — PRENATAL MULTIVITAMIN CH: 1.0000 | ORAL_TABLET | Freq: Every day | ORAL | Status: AC

## 2015-08-07 NOTE — Progress Notes (Signed)
D/C order from MD.  Reviewed d/c instructions and prescriptions with patient and answered any questions.  Patient d/c home with infant via wheelchair by nursing/auxillary. 

## 2015-08-07 NOTE — Discharge Instructions (Signed)
Vaginal Delivery, Care After Refer to this sheet in the next few weeks. These discharge instructions provide you with information on caring for yourself after delivery. Your caregiver may also give you specific instructions. Your treatment has been planned according to the most current medical practices available, but problems sometimes occur. Call your caregiver if you have any problems or questions after you go home. HOME CARE INSTRUCTIONS 1. Take over-the-counter or prescription medicines only as directed by your caregiver or pharmacist. 2. Do not drink alcohol, especially if you are breastfeeding or taking medicine to relieve pain. 3. Do not smoke tobacco. 4. Continue to use good perineal care. Good perineal care includes: 1. Wiping your perineum from back to front 2. Keeping your perineum clean. 3. You can do sitz baths twice a day, to help keep this area clean 5. Do not use tampons, douche or have sex until your caregiver says it is okay. 6. Shower only and avoid sitting in submerged water, aside from sitz baths 7. Wear a well-fitting bra that provides breast support. 8. Eat healthy foods. 9. Drink enough fluids to keep your urine clear or pale yellow. 10. Eat high-fiber foods such as whole grain cereals and breads, brown rice, beans, and fresh fruits and vegetables every day. These foods may help prevent or relieve constipation. 11. Avoid constipation with high fiber foods or medications, such as miralax or metamucil 12. Follow your caregiver's recommendations regarding resumption of activities such as climbing stairs, driving, lifting, exercising, or traveling. 13. Talk to your caregiver about resuming sexual activities. Resumption of sexual activities is dependent upon your risk of infection, your rate of healing, and your comfort and desire to resume sexual activity. 14. Try to have someone help you with your household activities and your newborn for at least a few days after you leave  the hospital. 15. Rest as much as possible. Try to rest or take a nap when your newborn is sleeping. 16. Increase your activities gradually. 17. Keep all of your scheduled postpartum appointments. It is very important to keep your scheduled follow-up appointments. At these appointments, your caregiver will be checking to make sure that you are healing physically and emotionally. SEEK MEDICAL CARE IF:   You are passing large clots from your vagina. Save any clots to show your caregiver.  You have a foul smelling discharge from your vagina.  You have trouble urinating.  You are urinating frequently.  You have pain when you urinate.  You have a change in your bowel movements.  You have increasing redness, pain, or swelling near your vaginal incision (episiotomy) or vaginal tear.  You have pus draining from your episiotomy or vaginal tear.  Your episiotomy or vaginal tear is separating.  You have painful, hard, or reddened breasts.  You have a severe headache.  You have blurred vision or see spots.  You feel sad or depressed.  You have thoughts of hurting yourself or your newborn.  You have questions about your care, the care of your newborn, or medicines.  You are dizzy or light-headed.  You have a rash.  You have nausea or vomiting.  You were breastfeeding and have not had a menstrual period within 12 weeks after you stopped breastfeeding.  You are not breastfeeding and have not had a menstrual period by the 12th week after delivery.  You have a fever. SEEK IMMEDIATE MEDICAL CARE IF:   You have persistent pain.  You have chest pain.  You have shortness of breath.    You faint.  You have leg pain.  You have stomach pain.  Your vaginal bleeding saturates two or more sanitary pads in 1 hour. MAKE SURE YOU:   Understand these instructions.  Will watch your condition.  Will get help right away if you are not doing well or get worse. Document Released:  02/08/2000 Document Revised: 06/27/2013 Document Reviewed: 10/08/2011 ExitCare Patient Information 2015 [employer]. This information is not intended to replace advice given to you by your health care provider. Make sure you discuss any questions you have with your health care provider.  Sitz Bath A sitz bath is a warm water bath taken in the sitting position. The water covers only the hips and butt (buttocks). We recommend using one that fits in the toilet, to help with ease of use and cleanliness. It may be used for either healing or cleaning purposes. Sitz baths are also used to relieve pain, itching, or muscle tightening (spasms). The water may contain medicine. Moist heat will help you heal and relax.  HOME CARE  Take 3 to 4 sitz baths a day. 18. Fill the bathtub half-full with warm water. 19. Sit in the water and open the drain a little. 20. Turn on the warm water to keep the tub half-full. Keep the water running constantly. 21. Soak in the water for 15 to 20 minutes. 22. After the sitz bath, pat the affected area dry. GET HELP RIGHT AWAY IF: You get worse instead of better. Stop the sitz baths if you get worse. MAKE SURE YOU:  Understand these instructions.  Will watch your condition.  Will get help right away if you are not doing well or get worse. Document Released: 03/20/2004 Document Revised: 11/05/2011 Document Reviewed: 06/10/2010 ExitCare Patient Information 2015 [employer]. This information is not intended to replace advice given to you by your health care provider. Make sure you discuss any questions you have with your health care provider.    

## 2016-06-10 ENCOUNTER — Other Ambulatory Visit: Payer: Self-pay | Admitting: Obstetrics & Gynecology

## 2016-06-10 DIAGNOSIS — Z369 Encounter for antenatal screening, unspecified: Secondary | ICD-10-CM

## 2016-06-23 ENCOUNTER — Ambulatory Visit: Payer: 59

## 2016-11-01 ENCOUNTER — Encounter: Payer: Self-pay | Admitting: Emergency Medicine

## 2016-11-01 ENCOUNTER — Emergency Department
Admission: EM | Admit: 2016-11-01 | Discharge: 2016-11-01 | Disposition: A | Discharge status: Home / Self Care | Payer: 59 | Attending: Emergency Medicine | Admitting: Emergency Medicine

## 2016-11-01 DIAGNOSIS — Z23 Encounter for immunization: Secondary | ICD-10-CM | POA: Insufficient documentation

## 2016-11-01 DIAGNOSIS — S61511A Laceration without foreign body of right wrist, initial encounter: Secondary | ICD-10-CM

## 2016-11-01 DIAGNOSIS — Y939 Activity, unspecified: Secondary | ICD-10-CM | POA: Diagnosis not present

## 2016-11-01 DIAGNOSIS — Y929 Unspecified place or not applicable: Secondary | ICD-10-CM | POA: Diagnosis not present

## 2016-11-01 DIAGNOSIS — Y999 Unspecified external cause status: Secondary | ICD-10-CM | POA: Insufficient documentation

## 2016-11-01 DIAGNOSIS — Z79899 Other long term (current) drug therapy: Secondary | ICD-10-CM | POA: Insufficient documentation

## 2016-11-01 DIAGNOSIS — W25XXXA Contact with sharp glass, initial encounter: Secondary | ICD-10-CM | POA: Diagnosis not present

## 2016-11-01 DIAGNOSIS — S61521A Laceration with foreign body of right wrist, initial encounter: Secondary | ICD-10-CM | POA: Diagnosis present

## 2016-11-01 DIAGNOSIS — S60851A Superficial foreign body of right wrist, initial encounter: Secondary | ICD-10-CM

## 2016-11-01 MED ORDER — TETANUS-DIPHTH-ACELL PERTUSSIS 5-2.5-18.5 LF-MCG/0.5 IM SUSP
0.5000 mL | Freq: Once | INTRAMUSCULAR | Status: AC
  Administered 2016-11-01: 0.5 mL via INTRAMUSCULAR
  Filled 2016-11-01: qty 0.5

## 2016-11-01 MED ORDER — LIDOCAINE-EPINEPHRINE-TETRACAINE (LET) SOLUTION
3.0000 mL | Freq: Once | NASAL | Status: AC
  Administered 2016-11-01: 3 mL via TOPICAL
  Filled 2016-11-01: qty 3

## 2016-11-01 MED ORDER — HYDROCODONE-ACETAMINOPHEN 5-325 MG PO TABS
1.0000 | ORAL_TABLET | ORAL | Status: AC
  Administered 2016-11-01: 1 via ORAL
  Filled 2016-11-01: qty 1

## 2016-11-01 MED ORDER — HYDROCODONE-ACETAMINOPHEN 5-325 MG PO TABS: 1.0000 | ORAL_TABLET | Freq: Four times a day (QID) | ORAL | 0 refills | Status: DC | PRN

## 2016-11-01 NOTE — ED Provider Notes (Signed)
[facility]-EMERGENCY DEPARTMENT Provider Note   CSN: [id number] Arrival date & time: 11/01/16  2051     History   Chief Complaint Chief Complaint  Patient presents with  . Laceration    HPI Lindsay Fitzgerald is a 41 y.o. female presents to the emergency department for evaluation of right wrist laceration.patient states she leaned in towards a window,cut her right wrist. Tetanus is not up-to-date. Laceration is along the on her aspect of her right wrist. She has a few shards of glassin her right index finger. She denies any other injuries to her body. She denies any numbness or tingling throughout the right hand. Bleeding is well controlled.  HPI  No past medical history on file.  Patient Active Problem List   Diagnosis Date Noted  . Anemia affecting pregnancy in third trimester 08/05/2015  . History of precipitous labor and delivery 08/05/2015  . Postpartum care following vaginal delivery 07/23/2015    No past surgical history on file.  OB History    Gravida Para Term Preterm AB Living   6 4 4   2 1    SAB TAB Ectopic Multiple Live Births   2     0 1       Home Medications    Prior to Admission medications   Medication Sig Start Date End Date Taking? Authorizing Provider  Ferrous Fumarate-Folic Acid 324-1 MG TABS Take 1 tablet by mouth daily. 08/07/15   Farrel ConnersGutierrez, Colleen, CNM  HYDROcodone-acetaminophen (NORCO) 5-325 MG tablet Take 1 tablet by mouth every 6 (six) hours as needed for moderate pain. 11/01/16   Evon SlackGaines, Thomas C, PA-C  ibuprofen (ADVIL,MOTRIN) 600 MG tablet Take 1 tablet (600 mg total) by mouth every 6 (six) hours as needed for mild pain, moderate pain or cramping. 08/07/15   Farrel ConnersGutierrez, Colleen, CNM  oxyCODONE (OXY IR/ROXICODONE) 5 MG immediate release tablet Take 1 tablet (5 mg total) by mouth every 6 (six) hours as needed for moderate pain or severe pain. 08/07/15   Farrel ConnersGutierrez, Colleen, CNM  Prenatal Vit-Fe Fumarate-FA (PRENATAL MULTIVITAMIN) TABS tablet Take 1 tablet by  mouth daily at 12 noon. 08/07/15   Farrel ConnersGutierrez, Colleen, CNM    Family History No family history on file.  Social History Social History  Substance Use Topics  . Smoking status: Never Smoker  . Smokeless tobacco: Never Used  . Alcohol use No     Allergies   Erythromycin   Review of Systems Review of Systems  Constitutional: Negative for activity change and fever.  Musculoskeletal: Negative for arthralgias, gait problem, myalgias and neck pain.  Skin: Positive for wound. Negative for rash.  Neurological: Negative for weakness, numbness and headaches.  Hematological: Negative for adenopathy.  Psychiatric/Behavioral: Negative for agitation, behavioral problems and confusion.     Physical Exam Updated Vital Signs BP 128/73 (BP Location: Left Arm)   Pulse 86   Temp 97.7 F (36.5 C) (Oral)   Resp (!) 22   Ht 4\' 11"  (1.499 m)   Wt 41.7 kg (92 lb)   LMP 10/16/2016 (Exact Date)   SpO2 99%   BMI 18.58 kg/m   Physical Exam  Constitutional: She is oriented to person, place, and time. She appears well-developed and well-nourished.  HENT:  Head: Normocephalic and atraumatic.  Eyes: Conjunctivae are normal.  Neck: Normal range of motion.  Cardiovascular: Normal rate.   Pulmonary/Chest: Effort normal. No respiratory distress.  Musculoskeletal: Normal range of motion.  Examination of the right wrist shows the patient has a 2 cm V-shaped  laceration along the volar, ulnar aspect of the wrist. Glass is removed. Wound is thoroughly irrigated and reviewed in a bloodless field. Patient has small shards of glass in the volar aspect distal phalanx of the right index finger, this is removed. All other visible and palpable pieces of glassare removed. Patient has no tendon deficits. No sensation deficits. She has full composite fist.  Neurological: She is alert and oriented to person, place, and time.  Skin: Skin is warm. No rash noted.  Psychiatric: She has a normal mood and affect. Her  behavior is normal. Thought content normal.     ED Treatments / Results  Labs (all labs ordered are listed, but only abnormal results are displayed) Labs Reviewed - No data to display  EKG  EKG Interpretation None       Radiology No results found.  Procedures Procedures (including critical care time) LACERATION REPAIR Performed by: Patience Musca Authorized by: Patience Musca Consent: Verbal consent obtained. Risks and benefits: risks, benefits and alternatives were discussed Consent given by: patient Patient identity confirmed: provided demographic data Prepped and Draped in normal sterile fashion Wound explored  Laceration Location: right wrist  Laceration Length: 2cm  No Foreign Bodies seen or palpated  Anesthesia: local infiltration  Local anesthetic:topical lidocaine, epinephrine, tetracaine  Anesthetic total: 2 ml  Irrigation method: syringe Amount of cleaning: standard  Skin closure: 6 imple interrup  Number of sutures: 6  Technique: 5-0 nylon, simple interrupted.  Patient tolerance: Patient tolerated the procedure well with no immediate complications.   Medications Ordered in ED Medications  lidocaine-EPINEPHrine-tetracaine (LET) solution (3 mLs Topical Given by Other 11/01/16 2128)  HYDROcodone-acetaminophen (NORCO/VICODIN) 5-325 MG per tablet 1 tablet (1 tablet Oral Given 11/01/16 2126)  Tdap (BOOSTRIX) injection 0.5 mL (0.5 mLs Intramuscular Given 11/01/16 2126)     Initial Impression / Assessment and Plan / ED Course  I have reviewed the triage vital signs and the nursing notes.  Pertinent labs & imaging results that were available during my care of the patient were reviewed by me and considered in my medical decision making (see chart for details).     41 year old female with laceration to the right wrist. No tendon or neurological deficits. All pieces of glass that were visualized and palpated were removed. Wound  was thoroughly irrigated and interviewed in a bloodless field. Tetanus was updated. Patient will follow-up next 10 days for suture removal.  Final Clinical Impressions(s) / ED Diagnoses   Final diagnoses:  Foreign body of skin of wrist, right, initial encounter  Laceration of right wrist, initial encounter    New Prescriptions New Prescriptions   HYDROCODONE-ACETAMINOPHEN (NORCO) 5-325 MG TABLET    Take 1 tablet by mouth every 6 (six) hours as needed for moderate pain.     Evon Slack, PA-C 11/01/16 2254    Jeanmarie Plant, MD 11/02/16 [phone]

## 2016-11-01 NOTE — ED Notes (Signed)

## 2016-11-01 NOTE — ED Triage Notes (Signed)
Patient reports tripped and fell into window.  Patient with laceration to right hand at base of right thumb.

## 2016-11-01 NOTE — Discharge Instructions (Signed)
Please keep the incision site clean. He may continue with Ace wrap as needed to protect the incision site. Return to the ER or walking clinic for any redness pain swelling or fevers. Please have sutures removed in 8-10 days.

## 2016-11-12 ENCOUNTER — Emergency Department
Admission: EM | Admit: 2016-11-12 | Discharge: 2016-11-12 | Disposition: A | Discharge status: Home / Self Care | Payer: 59 | Attending: Emergency Medicine | Admitting: Emergency Medicine

## 2016-11-12 DIAGNOSIS — B9689 Other specified bacterial agents as the cause of diseases classified elsewhere: Secondary | ICD-10-CM | POA: Insufficient documentation

## 2016-11-12 DIAGNOSIS — Z4802 Encounter for removal of sutures: Secondary | ICD-10-CM

## 2016-11-12 DIAGNOSIS — Y629 Failure of sterile precautions during unspecified surgical and medical care: Secondary | ICD-10-CM | POA: Diagnosis not present

## 2016-11-12 DIAGNOSIS — T8130XA Disruption of wound, unspecified, initial encounter: Secondary | ICD-10-CM | POA: Diagnosis not present

## 2016-11-12 DIAGNOSIS — L089 Local infection of the skin and subcutaneous tissue, unspecified: Secondary | ICD-10-CM | POA: Insufficient documentation

## 2016-11-12 MED ORDER — SULFAMETHOXAZOLE-TRIMETHOPRIM 800-160 MG PO TABS: 1.0000 | ORAL_TABLET | Freq: Two times a day (BID) | ORAL | 0 refills | Status: DC

## 2016-11-12 MED ORDER — TRAMADOL HCL 50 MG PO TABS: 50.0000 mg | ORAL_TABLET | Freq: Four times a day (QID) | ORAL | 0 refills | Status: DC | PRN

## 2016-11-12 NOTE — ED Triage Notes (Signed)
Pt is here for suture removal

## 2016-11-12 NOTE — ED Provider Notes (Signed)
[facility] Emergency Department Provider Note   ____________________________________________   First MD Initiated Contact with Patient 11/12/16 [phone]     (approximate)  I have reviewed the triage vital signs and the nursing notes.   HISTORY  Chief Complaint Suture / Staple Removal    HPI Lindsay Fitzgerald is a 41 y.o. female patient presents for suture removal secondary to laceration to the left wrist. Sutures placed 10 days ago.Patient states wound is not healing well and believes infected. Since departure patient has been placing Neosporin on the wound.   History reviewed. No pertinent past medical history.  Patient Active Problem List   Diagnosis Date Noted  . Anemia affecting pregnancy in third trimester 08/05/2015  . History of precipitous labor and delivery 08/05/2015  . Postpartum care following vaginal delivery 07/23/2015    History reviewed. No pertinent surgical history.  Prior to Admission medications   Medication Sig Start Date End Date Taking? Authorizing Provider  Ferrous Fumarate-Folic Acid 324-1 MG TABS Take 1 tablet by mouth daily. 08/07/15   Farrel Conners, CNM  HYDROcodone-acetaminophen (NORCO) 5-325 MG tablet Take 1 tablet by mouth every 6 (six) hours as needed for moderate pain. 11/01/16   Evon Slack, PA-C  ibuprofen (ADVIL,MOTRIN) 600 MG tablet Take 1 tablet (600 mg total) by mouth every 6 (six) hours as needed for mild pain, moderate pain or cramping. 08/07/15   Farrel Conners, CNM  oxyCODONE (OXY IR/ROXICODONE) 5 MG immediate release tablet Take 1 tablet (5 mg total) by mouth every 6 (six) hours as needed for moderate pain or severe pain. 08/07/15   Farrel Conners, CNM  Prenatal Vit-Fe Fumarate-FA (PRENATAL MULTIVITAMIN) TABS tablet Take 1 tablet by mouth daily at 12 noon. 08/07/15   Farrel Conners, CNM  sulfamethoxazole-trimethoprim (BACTRIM DS,SEPTRA DS) 800-160 MG tablet Take 1 tablet by mouth 2 (two) times daily.  11/12/16   Joni Reining, PA-C  traMADol (ULTRAM) 50 MG tablet Take 1 tablet (50 mg total) by mouth every 6 (six) hours as needed for moderate pain. 11/12/16   Joni Reining, PA-C    Allergies Erythromycin  No family history on file.  Social History Social History  Substance Use Topics  . Smoking status: Never Smoker  . Smokeless tobacco: Never Used  . Alcohol use No    Review of Systems  Constitutional: No fever/chills Eyes: No visual changes. ENT: No sore throat. Cardiovascular: Denies chest pain. Respiratory: Denies shortness of breath. Gastrointestinal: No abdominal pain.  No nausea, no vomiting.  No diarrhea.  No constipation. Genitourinary: Negative for dysuria. Musculoskeletal: Negative for back pain. Skin: Negative for rash. Right wrist laceration Neurological: Negative for headaches, focal weakness or numbness. Allergic/Immunilogical: Erythromycin ____________________________________________   PHYSICAL EXAM:  VITAL SIGNS: ED Triage Vitals [11/12/16 0806]  Enc Vitals Group     BP 115/61     Pulse Rate 73     Resp 16     Temp 98.2 F (36.8 C)     Temp Source Oral     SpO2 100 %     Weight 92 lb (41.7 kg)     Height  (1.499 m)     Head Circumference      Peak Flow      Pain Score      Pain Loc      Pain Edu?      Excl. in GC?     Constitutional: Alert and oriented. Well appearing and in no acute distress. Cardiovascular: Normal rate,  regular rhythm. Grossly normal heart sounds.  Good peripheral circulation. Respiratory: Normal respiratory effort.  No retractions. Lungs CTAB. Neurologic:  Normal speech and language. No gross focal neurologic deficits are appreciated. No gait instability. Skin:  Suture site is erythematous with dihiscence Psychiatric: Mood and affect are normal. Speech and behavior are normal.  ____________________________________________   LABS (all labs ordered are listed, but only abnormal results are displayed)  Labs  Reviewed - No data to display ____________________________________________  EKG   ____________________________________________  RADIOLOGY  No results found.  ____________________________________________   PROCEDURES  Procedure(s) performed: None  Procedures  Critical Care performed: No  ____________________________________________   INITIAL IMPRESSION / ASSESSMENT AND PLAN / ED COURSE  Pertinent labs & imaging results that were available during my care of the patient were reviewed by me and considered in my medical decision making (see chart for details).  Patient presents for suture removal left wrist status post laceration 10 days ago. Wound site is macerated and erythematous. 6 sutures were removed. Area was clean and Steri-Strips applied. Patient given discharge Instructions and a prescription for Bactrim DS and tramadol. Patient advised return back if no improvement or worsening of complaint.      ____________________________________________   FINAL CLINICAL IMPRESSION(S) / ED DIAGNOSES  Final diagnoses:  Visit for suture removal  Wound dehiscence  Localized bacterial skin infection      NEW MEDICATIONS STARTED DURING THIS VISIT:  Discharge Medication List as of 11/12/2016  8:44 AM    START taking these medications   Details  sulfamethoxazole-trimethoprim (BACTRIM DS,SEPTRA DS) 800-160 MG tablet Take 1 tablet by mouth 2 (two) times daily., Starting Wed 11/12/2016, Print    traMADol (ULTRAM) 50 MG tablet Take 1 tablet (50 mg total) by mouth every 6 (six) hours as needed for moderate pain., Starting Wed 11/12/2016, Print         Note:  This document was prepared using Dragon voice recognition software and may include unintentional dictation errors.    Joni Reining, PA-C 11/12/16 1610    Sharman Cheek, MD 11/14/16 [phone]

## 2016-11-12 NOTE — ED Notes (Signed)
PA in room to remove sutures.  Will continue to monitor.

## 2017-01-28 LAB — OB RESULTS CONSOLE HEPATITIS B SURFACE ANTIGEN: HEP B S AG: NEGATIVE

## 2017-01-28 LAB — OB RESULTS CONSOLE VARICELLA ZOSTER ANTIBODY, IGG: Varicella: IMMUNE

## 2017-01-28 LAB — OB RESULTS CONSOLE RPR: RPR: NONREACTIVE

## 2017-01-28 LAB — OB RESULTS CONSOLE RUBELLA ANTIBODY, IGM: Rubella: IMMUNE

## 2017-02-24 NOTE — L&D Delivery Note (Signed)
Date of delivery: 08/15/17 Estimated Date of Delivery: 08/21/17 Patient's last menstrual period was 11/14/2016. EGA: 6390w1d  Delivery Note At 8:19 PM a viable female was delivered via Vaginal, Spontaneous (Presentation direct OA, cephalic).  APGAR: 8, 8 ; weight pending - educated guesses: 6.5, 6.8, 6.9, 6.12, 6.15, 7.0 and 7.1 - to be determined.   Placenta status: spontaneous, intact.  Cord: 3vv  with the following complications: none apparent.  Cord pH: not collected  Anesthesia:  none Episiotomy:  none Lacerations:  none Suture Repair: n/a Est. Blood Loss (mL): 270cc (measured)  Mom presented to L&D for IOL, given antibiotics for GBS prophylaxis then AROM'd for clear fluid. Was 5cm at 7:45 and then progressed over the next 30 minutes to complete, second stage: precipitous.  delivery of fetal head with restitution to LOA and then once shoulders were delivered, mom reached down, grasped the trunk, and delivered her own baby boy without difficulty, like a BOSS.  Baby placed on mom's chest, and attended to by peds.  Cord was then clamped and cut by FOB.  Placenta spontaneously delivered, intact.   IV pitocin given for hemorrhage prophylaxis.  We sang happy birthday to not-yet-named baby boy.  Mom to postpartum.  Baby to Couplet care / Skin to Skin.  Elisheba Mcdonnell C Leodis Alcocer 08/15/2017, 8:54 PM

## 2017-07-27 LAB — OB RESULTS CONSOLE RPR: RPR: NONREACTIVE

## 2017-07-27 LAB — OB RESULTS CONSOLE GC/CHLAMYDIA
CHLAMYDIA, DNA PROBE: NEGATIVE
GC PROBE AMP, GENITAL: NEGATIVE

## 2017-07-27 LAB — OB RESULTS CONSOLE GBS: GBS: POSITIVE

## 2017-07-27 LAB — OB RESULTS CONSOLE HIV ANTIBODY (ROUTINE TESTING): HIV: NONREACTIVE

## 2017-08-15 ENCOUNTER — Encounter: Payer: Self-pay | Admitting: *Deleted

## 2017-08-15 ENCOUNTER — Inpatient Hospital Stay
Admission: EM | Admit: 2017-08-15 | Discharge: 2017-08-17 | DRG: 807 | Disposition: A | Discharge status: Home / Self Care | Payer: 59 | Attending: Obstetrics and Gynecology | Admitting: Obstetrics and Gynecology

## 2017-08-15 ENCOUNTER — Other Ambulatory Visit: Payer: Self-pay

## 2017-08-15 DIAGNOSIS — D649 Anemia, unspecified: Secondary | ICD-10-CM | POA: Diagnosis present

## 2017-08-15 DIAGNOSIS — Z3483 Encounter for supervision of other normal pregnancy, third trimester: Secondary | ICD-10-CM | POA: Diagnosis present

## 2017-08-15 DIAGNOSIS — O9902 Anemia complicating childbirth: Secondary | ICD-10-CM | POA: Diagnosis present

## 2017-08-15 DIAGNOSIS — Z3A39 39 weeks gestation of pregnancy: Secondary | ICD-10-CM | POA: Diagnosis not present

## 2017-08-15 DIAGNOSIS — O99824 Streptococcus B carrier state complicating childbirth: Principal | ICD-10-CM | POA: Diagnosis present

## 2017-08-15 LAB — CBC
HCT: 33 % — ABNORMAL LOW (ref 35.0–47.0)
HEMOGLOBIN: 10.8 g/dL — AB (ref 12.0–16.0)
MCH: 26.5 pg (ref 26.0–34.0)
MCHC: 32.8 g/dL (ref 32.0–36.0)
MCV: 80.6 fL (ref 80.0–100.0)
Platelets: 195 10*3/uL (ref 150–440)
RBC: 4.09 MIL/uL (ref 3.80–5.20)
RDW: 15.7 % — ABNORMAL HIGH (ref 11.5–14.5)
WBC: 12.3 10*3/uL — AB (ref 3.6–11.0)

## 2017-08-15 LAB — TYPE AND SCREEN
ABO/RH(D): O POS
ANTIBODY SCREEN: NEGATIVE

## 2017-08-15 MED ORDER — ACETAMINOPHEN 500 MG PO TABS
1000.0000 mg | ORAL_TABLET | Freq: Four times a day (QID) | ORAL | Status: DC | PRN
  Administered 2017-08-15 – 2017-08-17 (×7): 1000 mg via ORAL
  Filled 2017-08-15 (×6): qty 2

## 2017-08-15 MED ORDER — AMMONIA AROMATIC IN INHA
RESPIRATORY_TRACT | Status: AC
  Filled 2017-08-15: qty 10

## 2017-08-15 MED ORDER — BENZOCAINE-MENTHOL 20-0.5 % EX AERO
1.0000 "application " | INHALATION_SPRAY | CUTANEOUS | Status: DC | PRN
  Filled 2017-08-15: qty 56

## 2017-08-15 MED ORDER — SODIUM CHLORIDE 0.9 % IV SOLN
1.0000 g | INTRAVENOUS | Status: DC
  Administered 2017-08-15 (×2): 1 g via INTRAVENOUS
  Filled 2017-08-15 (×2): qty 1000

## 2017-08-15 MED ORDER — LIDOCAINE HCL (PF) 1 % IJ SOLN
INTRAMUSCULAR | Status: AC
  Filled 2017-08-15: qty 30

## 2017-08-15 MED ORDER — TERBUTALINE SULFATE 1 MG/ML IJ SOLN: 0.2500 mg | Freq: Once | INTRAMUSCULAR | Status: DC | PRN

## 2017-08-15 MED ORDER — IBUPROFEN 600 MG PO TABS
ORAL_TABLET | ORAL | Status: AC
  Administered 2017-08-15: 600 mg via ORAL
  Filled 2017-08-15: qty 1

## 2017-08-15 MED ORDER — OXYTOCIN 10 UNIT/ML IJ SOLN
INTRAMUSCULAR | Status: AC
  Filled 2017-08-15: qty 2

## 2017-08-15 MED ORDER — MISOPROSTOL 200 MCG PO TABS
ORAL_TABLET | ORAL | Status: AC
  Filled 2017-08-15: qty 4

## 2017-08-15 MED ORDER — BENZOCAINE-MENTHOL 20-0.5 % EX AERO
INHALATION_SPRAY | CUTANEOUS | Status: AC
  Filled 2017-08-15: qty 56

## 2017-08-15 MED ORDER — ACETAMINOPHEN 500 MG PO TABS
ORAL_TABLET | ORAL | Status: AC
  Administered 2017-08-15: 1000 mg via ORAL
  Filled 2017-08-15: qty 2

## 2017-08-15 MED ORDER — BUTORPHANOL TARTRATE 2 MG/ML IJ SOLN
1.0000 mg | INTRAMUSCULAR | Status: DC | PRN
  Administered 2017-08-15: 2 mg via INTRAVENOUS

## 2017-08-15 MED ORDER — MISOPROSTOL 25 MCG QUARTER TABLET
25.0000 ug | ORAL_TABLET | ORAL | Status: DC | PRN
  Filled 2017-08-15: qty 1

## 2017-08-15 MED ORDER — OXYCODONE HCL 5 MG PO TABS
ORAL_TABLET | ORAL | Status: AC
  Filled 2017-08-15: qty 1

## 2017-08-15 MED ORDER — BUTORPHANOL TARTRATE 2 MG/ML IJ SOLN
INTRAMUSCULAR | Status: AC
  Filled 2017-08-15: qty 1

## 2017-08-15 MED ORDER — OXYCODONE HCL 5 MG PO TABS
5.0000 mg | ORAL_TABLET | ORAL | Status: AC | PRN
Start: 2017-08-15 — End: 2017-08-16
  Administered 2017-08-15 – 2017-08-16 (×3): 5 mg via ORAL
  Filled 2017-08-15 (×2): qty 1

## 2017-08-15 MED ORDER — SODIUM CHLORIDE 0.9 % IV SOLN
2.0000 g | Freq: Once | INTRAVENOUS | Status: AC
  Administered 2017-08-15: 2 g via INTRAVENOUS
  Filled 2017-08-15: qty 2000

## 2017-08-15 MED ORDER — OXYTOCIN 40 UNITS IN LACTATED RINGERS INFUSION - SIMPLE MED
INTRAVENOUS | Status: AC
  Administered 2017-08-15: 500 [IU]
  Filled 2017-08-15: qty 1000

## 2017-08-15 MED ORDER — IBUPROFEN 600 MG PO TABS
600.0000 mg | ORAL_TABLET | Freq: Four times a day (QID) | ORAL | Status: DC
  Administered 2017-08-15 – 2017-08-17 (×8): 600 mg via ORAL
  Filled 2017-08-15 (×7): qty 1

## 2017-08-15 MED ORDER — LACTATED RINGERS IV SOLN
INTRAVENOUS | Status: DC
Start: 2017-08-15 — End: 2017-08-17
  Administered 2017-08-15: 11:00:00 via INTRAVENOUS

## 2017-08-15 NOTE — Progress Notes (Signed)
S: Starting to hurt and wants meds + CTX, no LOF, VB. O: Vitals:   08/15/17 0937 08/15/17 1013 08/15/17 1333 08/15/17 1457  BP:  122/90 134/86 (!) 128/97  Pulse:  69 60 68  Resp:  16 16 18   Temp:  98 F (36.7 C) 97.7 F (36.5 C) 98.2 F (36.8 C)  TempSrc:  Oral Axillary Oral  Weight: 133 lb (60.3 kg)     Height: 4\' 11"  (1.499 m)        Gen: NAD, AAOx3      Abd: FNTTP      Ext: Non-tender, Nonedmeatous    FHT: + mod var + accelerations no decelerations TOCO: Q 1.5-2 min SVE: 3/80/vtx-1   A/P:  42 y.o. yo [id number] at 4824w1d for    Labor: progressing   FWB: Reassuring Cat 1 tracing. EFW 7#2oz  GBS: pos  Contraception: will decide Myrtie Cruisearon W. Luciel Brickman,RN, MSN, CNM, FNP Certified Nurse Midwife [facility]  [facility]   Sharee Pimplearon W Briena Swingler 3:19 PM

## 2017-08-15 NOTE — Progress Notes (Signed)
Intrapartum progress note  S: comfortable between contractions; ctx becoming increasingly painful, s/p stadol x1.  doula and husband @ bedside.  O: BP 126/86   Pulse 70   Temp 98.3 F (36.8 C) (Oral)   Resp 18   Ht 4\' 11"  (1.499 m)   Wt 60.3 kg (133 lb)   LMP 11/14/2016   BMI 26.86 kg/m   FHT: 115 mod + accels, occasional early decelerations TOCO: q2-3 SVE: 4.5 / 50 /-2, soft anterior  Results for orders placed or performed during the hospital encounter of 08/15/17 (from the past 24 hour(s))  CBC     Status: Abnormal   Collection Time: 08/15/17 10:44 AM  Result Value Ref Range   WBC 12.3 (H) 3.6 - 11.0 K/uL   RBC 4.09 3.80 - 5.20 MIL/uL   Hemoglobin 10.8 (L) 12.0 - 16.0 g/dL   HCT 40.333.0 (L) 47.435.0 - 25.947.0 %   MCV 80.6 80.0 - 100.0 fL   MCH 26.5 26.0 - 34.0 pg   MCHC 32.8 32.0 - 36.0 g/dL   RDW 56.315.7 (H) 87.511.5 - 64.314.5 %   Platelets 195 150 - 440 K/uL  Type and screen     Status: None (Preliminary result)   Collection Time: 08/15/17 10:45 AM  Result Value Ref Range   ABO/RH(D) PENDING    Antibody Screen PENDING    Sample Expiration      08/18/2017 Performed at [facility], [address] [place], [place] [address]   Type and screen     Status: None   Collection Time: 08/15/17 12:36 PM  Result Value Ref Range   ABO/RH(D) O POS    Antibody Screen NEG    Sample Expiration      08/18/2017 Performed at [facility], [address] [place], [place] [address]      A/P: [id number] [id number] @ 39+1 with elective IOL  1. IUP: category 1 tracing 2. IOL: s/p AROM.  Continue expectant management after AROM.  Will consider pitocin augmentation if slow or no cervical change.  ----- Ranae Plumberhelsea Cordero Surette, MD Attending Obstetrician and Gynecologist [facility], Department of OB/GYN [facility]

## 2017-08-15 NOTE — H&P (Signed)
OB History & Physical   History of Present Illness:  Chief Complaint: Here for elective IOL   HPI:  Ethelwyn Ples SpecterBahrs is a 42 y.o. [id number]  female at 692w1d dated by Patient's last menstrual period was 11/14/2016. confirmed with early ultrasound Estimated Date of Delivery: 08/21/17  She presents to L&D for elective IOL, multiparous with favorable cervix. +FM, + CTX, no LOF, no VB  Pregnancy Issues: 1. AMA - age 42 2. GBS pos   Maternal Medical History:  Med hx: History of gestational HTN, History of anemia  Surgical hx: colposcopy 2010       Allergies  Allergen Reactions  . Erythromycin Nausea And Vomiting           Prior to Admission medications   Medication Sig Start Date End Date Taking? Authorizing Provider  Prenatal vitamin   [facility]    Social History: She  reports that she has never smoked. She has never used smokeless tobacco. She reports that she does not drink alcohol or use drugs.  Family History: mother- breast cancer  Review of Systems: A full review of systems was performed and negative except as noted in the HPI.     Physical Exam:  Vital Signs: BP 122/90 (BP Location: Left Arm)   Pulse 69   Temp 98 F (36.7 C) (Oral)   Resp 16   Ht 4\' 11"  (1.499 m)   Wt 133 lb (60.3 kg)   LMP 11/14/2016   BMI 26.86 kg/m  General: no acute distress.  HEENT: normocephalic, atraumatic Heart: regular rate & rhythm.  No murmurs/rubs/gallops Lungs: clear to auscultation bilaterally, normal respiratory effort Abdomen: soft, gravid, non-tender;  EFW: Pelvic:              External: Normal external female genitalia             Cervix:  3/50/-2                Extremities: non-tender, symmetric, 1+ edema bilaterally.  DTRs: 2+/0 Neurologic: Alert & oriented x 3.    LabResultsLast24Hours  No results found for this or any previous visit (from the past 24 hour(s)).    Pertinent Results:  Prenatal Labs: Blood type/Rh  O positive  Antibody screen neg  Rubella Immune  Varicella Immune  RPR NR  HBsAg Neg  HIV NR  GC neg  Chlamydia neg  Genetic screening declined  1 hour GTT 90  3 hour GTT   GBS pos   FHT:130,  TOCO: q2-854min SVE:  3/50/-2  AROM'd for clear fluid by CNM Jones   Cephalic by leopolds   Assessment:  Saharra Ples SpecterBahrs is a 42 y.o. [id number] female at 432w1d with   Plan:  1. Admit to Labor & Delivery 2. CBC, T&S, Clrs, IVF 3. GBS pos - prophylaxis indicated, s/p ABX before ROM 4. Consents obtained. 5. Continuous efm/toco 6. IOL: Patient has birth plan requesting as few medicine interventions as possible. S/p AROM.     ----- Ranae Plumberhelsea Ward, MD Attending Obstetrician and Gynecologist [facility], Department of OB/GYN [facility]

## 2017-08-15 NOTE — Discharge Summary (Signed)
Obstetrical Discharge Summary  Patient Name: Lindsay Fitzgerald DOB: 1975-03-28 MRN: [id number]  Date of Admission: 08/15/2017 Date of Delivery:08/15/17 Delivered by: Ranae Plumber, MD Date of Discharge: 08/17/2017  Primary OB: [facility]  YQM:VHQIONG'E last menstrual period was 11/14/2016. EDC Estimated Date of Delivery: 08/21/17 Gestational Age at Delivery: [redacted]w[redacted]d   Antepartum complications:  AMA GBS+ Grand Multiparous Anemia  Admitting Diagnosis: induction of labor Secondary Diagnosis: Patient Active Problem List   Diagnosis Date Noted  . Anemia affecting pregnancy in third trimester 08/05/2015  . History of precipitous labor and delivery 08/05/2015  . Postpartum care following vaginal delivery 07/23/2015    Augmentation: AROM Complications: None Intrapartum complications/course: Mom presented to L&D for IOL, given antibiotics for GBS prophylaxis then AROM'd for clear fluid. Was 5cm at 7:45 and then progressed over the next 30 minutes to complete, second stage: precipitous.  delivery of fetal head with restitution to LOA and then once shoulders were delivered, mom reached down, grasped the trunk, and delivered her own baby boy without difficulty, like a BOSS.  Baby placed on mom's chest, and attended to by peds.  Cord was then clamped and cut by FOB.  Placenta spontaneously delivered, intact.   IV pitocin given for hemorrhage prophylaxis.   Date of Delivery: 08/15/17 Delivered By: Leeroy Bock Berkley Cronkright Delivery Type: spontaneous vaginal delivery Anesthesia: none Placenta: sponatneous Laceration: none Episiotomy: none Newborn Data: Live born female  - not yet named Birth Weight:  3100g, 6lb 13oz APGAR: 8, 8  Newborn Delivery   Birth date/time:  08/15/2017 20:19:00 Delivery type:  Vaginal, Spontaneous     Postpartum Procedures: none  Post partum course:  Patient had an uncomplicated postpartum course.  By time of discharge on PPD#2, her pain was controlled on oral pain  medications; she had appropriate lochia and was ambulating, voiding without difficulty and tolerating regular diet.  She was deemed stable for discharge to home.   Discharge Physical Exam:  BP (!) 132/92 (BP Location: Left Arm)   Pulse (!) 58   Temp 98 F (36.7 C) (Oral)   Resp 18   Ht 4\' 11"  (1.499 m)   Wt 60.3 kg (133 lb)   LMP 11/14/2016   SpO2 99%   Breastfeeding? Unknown   BMI 26.86 kg/m   General: NAD CV: RRR Pulm: CTABL, nl effort ABD: s/nd/nt, fundus firm and below the umbilicus Lochia: moderate DVT Evaluation: LE non-ttp, no evidence of DVT on exam.  Hemoglobin  Date Value Ref Range Status  08/16/2017 10.9 (L) 12.0 - 16.0 g/dL Final   HGB  Date Value Ref Range Status  01/19/2014 9.9 (L) 12.0 - 16.0 g/dL Final   HCT  Date Value Ref Range Status  08/16/2017 33.9 (L) 35.0 - 47.0 % Final  01/19/2014 31.1 (L) 35.0 - 47.0 % Final     Disposition: stable, discharge to home. Baby Feeding: breastmilk  Baby Disposition: home with mom  Rh Immune globulin given: n/a Rubella vaccine given: n/a Tdap vaccine given in AP or PP setting: AP Flu vaccine given in AP or PP setting: AP  Contraception: TBD  Prenatal Labs:  Blood type/Rh O positive  Antibody screen neg  Rubella Immune  Varicella Immune  RPR NR  HBsAg Neg  HIV NR  GC neg  Chlamydia neg  Genetic screening declined  1 hour GTT 90  3 hour GTT   GBS pos      Plan:  Aubreyana Haug was discharged to home in good condition. Follow-up appointment with Dr Elesa Massed  in 6 weeks.  Discharge Medications: Allergies as of 08/17/2017      Reactions   Erythromycin Nausea And Vomiting      Medication List    STOP taking these medications   HYDROcodone-acetaminophen 5-325 MG tablet Commonly known as:  NORCO   oxyCODONE 5 MG immediate release tablet Commonly known as:  Oxy IR/ROXICODONE   sulfamethoxazole-trimethoprim 800-160 MG tablet Commonly known as:  BACTRIM DS,SEPTRA DS   traMADol 50 MG  tablet Commonly known as:  ULTRAM     TAKE these medications   acetaminophen 500 MG tablet Commonly known as:  TYLENOL Take 2 tablets (1,000 mg total) by mouth every 6 (six) hours as needed (for pain scale < 4).   Ferrous Fumarate-Folic Acid 324-1 MG Tabs Take 1 tablet by mouth daily.   ibuprofen 600 MG tablet Commonly known as:  ADVIL,MOTRIN Take 1 tablet (600 mg total) by mouth every 6 (six) hours. What changed:    when to take this  reasons to take this   prenatal multivitamin Tabs tablet Take 1 tablet by mouth daily at 12 noon.       Follow-up Information    Deisi Salonga, Elenora Fenderhelsea C, MD Follow up in 5 week(s).   Specialty:  Obstetrics and Gynecology Contact information: [address] [place] [id number] [phone]           Signed: ----- Ranae Plumberhelsea Lively Haberman, MD Attending Obstetrician and Gynecologist [facility], Department of OB/GYN [facility]

## 2017-08-15 NOTE — Progress Notes (Addendum)
OB History & Physical   History of Present Illness:  Chief Complaint: Here for IOL scheduled for today due to advanced cervical dilation.   HPI:  Lindsay Fitzgerald is a 42 y.o. [id number]  female at 5365w1d dated by   She presents to L&D for IOL after being  Scheduled by Dr. Elesa MassedWard. PNC included   +FM, no CTX, no LOF, no VB  Pregnancy Issues: 1. AMA 2. IOL due to advanced cx dilation 3. GBs pos  Maternal Medical History:  No past medical history on file.  No past surgical history on file.  Allergies  Allergen Reactions  . Erythromycin Nausea And Vomiting    Prior to Admission medications   Medication Sig Start Date End Date Taking? Authorizing Provider  Ferrous Fumarate-Folic Acid 324-1 MG TABS Take 1 tablet by mouth daily. 08/07/15  Yes Farrel ConnersGutierrez, Colleen, CNM  Prenatal Vit-Fe Fumarate-FA (PRENATAL MULTIVITAMIN) TABS tablet Take 1 tablet by mouth daily at 12 noon. 08/07/15  Yes Farrel ConnersGutierrez, Colleen, CNM  HYDROcodone-acetaminophen (NORCO) 5-325 MG tablet Take 1 tablet by mouth every 6 (six) hours as needed for moderate pain. Patient not taking: Reported on 08/15/2017 11/01/16   Evon SlackGaines, Thomas C, PA-C  ibuprofen (ADVIL,MOTRIN) 600 MG tablet Take 1 tablet (600 mg total) by mouth every 6 (six) hours as needed for mild pain, moderate pain or cramping. Patient not taking: Reported on 08/15/2017 08/07/15   Farrel ConnersGutierrez, Colleen, CNM  oxyCODONE (OXY IR/ROXICODONE) 5 MG immediate release tablet Take 1 tablet (5 mg total) by mouth every 6 (six) hours as needed for moderate pain or severe pain. Patient not taking: Reported on 08/15/2017 08/07/15   Farrel ConnersGutierrez, Colleen, CNM  sulfamethoxazole-trimethoprim (BACTRIM DS,SEPTRA DS) 800-160 MG tablet Take 1 tablet by mouth 2 (two) times daily. Patient not taking: Reported on 08/15/2017 11/12/16   Joni ReiningSmith, Ronald K, PA-C  traMADol (ULTRAM) 50 MG tablet Take 1 tablet (50 mg total) by mouth every 6 (six) hours as needed for moderate pain. Patient not taking: Reported on 08/15/2017  11/12/16   Joni ReiningSmith, Ronald K, PA-C     [facility]    Social History: She  reports that she has never smoked. She has never used smokeless tobacco. She reports that she does not drink alcohol or use drugs.  Family History: family history is not on file.   Review of Systems: A full review of systems was performed and negative except as noted in the HPI.     Physical Exam:  Vital Signs: BP 122/90 (BP Location: Left Arm)   Pulse 69   Temp 98 F (36.7 C) (Oral)   Resp 16   Ht 4\' 11"  (1.499 m)   Wt 133 lb (60.3 kg)   LMP 11/14/2016   BMI 26.86 kg/m  General: no acute distress.  HEENT: normocephalic, atraumatic Heart: regular rate & rhythm.  No murmurs/rubs/gallops Lungs: clear to auscultation bilaterally, normal respiratory effort Abdomen: soft, gravid, non-tender;  EFW: Pelvic:   External: Normal external female genitalia  Cervix:   /   /      Extremities: non-tender, symmetric, 1+ edema bilaterally.  DTRs: 2+/0 Neurologic: Alert & oriented x 3.    No results found for this or any previous visit (from the past 24 hour(s)).  Pertinent Results:  Prenatal Labs: Blood type/Rh   Antibody screen neg  Rubella Immune  Varicella Immune  RPR NR  HBsAg Neg  HIV NR  GC neg  Chlamydia neg  Genetic screening negative  1 hour GTT  3 hour GTT   GBS pos   FHT:130,  TOCO: SVE:    /   /      Cephalic by leopolds   Assessment:  Lindsay Fitzgerald is a 42 y.o. [id number] female at [redacted]w[redacted]d with   Plan:  1. Admit to Labor & Delivery 2. CBC, T&S, Clrs, IVF 3. GBS pos 4. Consents obtained. 5. Continuous efm/toco 6.   ----- Ranae Plumber, MD Attending Obstetrician and Gynecologist [facility], Department of OB/GYN [facility]

## 2017-08-16 LAB — CBC
HCT: 33.9 % — ABNORMAL LOW (ref 35.0–47.0)
Hemoglobin: 10.9 g/dL — ABNORMAL LOW (ref 12.0–16.0)
MCH: 26.2 pg (ref 26.0–34.0)
MCHC: 32.1 g/dL (ref 32.0–36.0)
MCV: 81.7 fL (ref 80.0–100.0)
Platelets: 190 10*3/uL (ref 150–440)
RBC: 4.15 MIL/uL (ref 3.80–5.20)
RDW: 15.5 % — AB (ref 11.5–14.5)
WBC: 15.4 10*3/uL — ABNORMAL HIGH (ref 3.6–11.0)

## 2017-08-16 MED ORDER — DIPHENHYDRAMINE HCL 25 MG PO CAPS: 25.0000 mg | ORAL_CAPSULE | Freq: Four times a day (QID) | ORAL | Status: DC | PRN

## 2017-08-16 MED ORDER — PRENATAL MULTIVITAMIN CH
1.0000 | ORAL_TABLET | Freq: Every day | ORAL | Status: DC
  Administered 2017-08-16 – 2017-08-17 (×2): 1 via ORAL
  Filled 2017-08-16 (×2): qty 1

## 2017-08-16 MED ORDER — DOCUSATE SODIUM 100 MG PO CAPS
100.0000 mg | ORAL_CAPSULE | Freq: Two times a day (BID) | ORAL | Status: DC
  Administered 2017-08-16 – 2017-08-17 (×3): 100 mg via ORAL
  Filled 2017-08-16 (×3): qty 1

## 2017-08-16 MED ORDER — SIMETHICONE 80 MG PO CHEW
80.0000 mg | CHEWABLE_TABLET | ORAL | Status: DC | PRN
  Administered 2017-08-16: 80 mg via ORAL

## 2017-08-16 MED ORDER — DIBUCAINE 1 % RE OINT: 1.0000 "application " | TOPICAL_OINTMENT | RECTAL | Status: DC | PRN

## 2017-08-16 MED ORDER — ONDANSETRON HCL 4 MG PO TABS: 4.0000 mg | ORAL_TABLET | ORAL | Status: DC | PRN

## 2017-08-16 MED ORDER — WITCH HAZEL-GLYCERIN EX PADS: 1.0000 "application " | MEDICATED_PAD | CUTANEOUS | Status: DC

## 2017-08-16 MED ORDER — ONDANSETRON HCL 4 MG/2ML IJ SOLN: 4.0000 mg | INTRAMUSCULAR | Status: DC | PRN

## 2017-08-16 MED ORDER — COCONUT OIL OIL
1.0000 "application " | TOPICAL_OIL | Status: DC | PRN
  Administered 2017-08-16: 1 via TOPICAL
  Filled 2017-08-16: qty 120

## 2017-08-16 NOTE — Progress Notes (Signed)
Post Partum Day 1 Subjective: "Breastfeeding baby well, I am just tired"  Objective: Blood pressure 135/83, pulse (!) 58, temperature 97.8 F (36.6 C), temperature source Oral, resp. rate 18, height 4\' 11"  (1.499 m), weight 133 lb (60.3 kg), last menstrual period 11/14/2016, SpO2 100 %, unknown if currently breastfeeding.  Physical Exam:  General:A,A&Ox3 Heart:S1S2, RRR, No M/R/G Lungs:CTA bilat, no W/R/R Lochia:mod, no clots  Uterine Fundus: FF.  DVT Evaluation: Neg Homans  Recent Labs    08/15/17 1044 08/16/17 0558  HGB 10.8* 10.9*  HCT 33.0* 33.9*  WBC 12.3* 15.4*  PLT 195 190    Assessment/Plan: A:  1.  PPD#1 NSVD of viable female  2.Breastfeeding P:1. DC in am. __________________________  Myrtie Cruisearon W. Jones,RN, MSN, CNM, FNP Certified Nurse Midwife [facility]   LOS: 1 day   Sharee Pimplearon W Jones 08/16/2017, 11:58 AM

## 2017-08-16 NOTE — Lactation Note (Signed)
This note was copied from a baby's chart. Lactation Consultation Note  Patient Name: Lindsay Fitzgerald ZOXWR'UToday's Date: 08/16/2017 Reason for consult: Term;Initial assessment;Other (Comment)(Baby was just circumcised) Mom latched without difficulty or assistance.  This is 5th baby Lindsay mom has breast fed.  Reviewed supply and demand, normal course of lactation and routine newborn feeding pattern.  Lactation name and number written on white board and encouraged to call for questions, concerns or assistance.    Maternal Data Formula Feeding for Exclusion: No Has patient been taught Hand Expression?: Yes Does the patient have breastfeeding experience prior to this delivery?: Yes  Feeding Feeding Type: Breast Fed Length of feed: 15 min  LATCH Score Latch: Grasps breast easily, tongue down, lips flanged, rhythmical sucking.  Audible Swallowing: A few with stimulation  Type of Nipple: Everted at rest and after stimulation  Comfort (Breast/Nipple): Soft / non-tender  Hold (Positioning): No assistance needed to correctly position infant at breast.  LATCH Score: 9  Interventions Interventions: Breast feeding basics reviewed;Breast compression;Skin to skin;Support pillows;Position options  Lactation Tools Discussed/Used     Consult Status Consult Status: Follow-up Date: 08/16/17 Follow-up type: Call as needed    Louis MeckelWilliams, Lindsay Fitzgerald 08/16/2017, 12:20 PM

## 2017-08-17 MED ORDER — DOCUSATE SODIUM 100 MG PO CAPS: 100.0000 mg | ORAL_CAPSULE | Freq: Two times a day (BID) | ORAL | 0 refills | Status: AC

## 2017-08-17 MED ORDER — IBUPROFEN 600 MG PO TABS: 600.0000 mg | ORAL_TABLET | Freq: Four times a day (QID) | ORAL | 1 refills | Status: AC

## 2017-08-17 MED ORDER — ACETAMINOPHEN 500 MG PO TABS: 1000.0000 mg | ORAL_TABLET | Freq: Four times a day (QID) | ORAL | 0 refills | Status: AC | PRN

## 2017-08-17 NOTE — Progress Notes (Signed)
Discharge order received from doctor. Reviewed discharge instructions and prescriptions with patient and answered all questions. Follow up appointment instructions given. Patient verbalized understanding. ID bands checked. Patient discharged home with infant via wheelchair by nursing/auxillary.    Jaysa Kise Garner, RN  

## 2017-08-17 NOTE — Progress Notes (Signed)
Patient's vital signs stable; fundus firm; small amount rubra lochia; voiding; tolerating regular diet well; good po fluids; good maternal-infant bonding observed; pain controlled with po motrin and po tylenol; plans for discharge today; has slept at intervals tonight.

## 2017-08-17 NOTE — Discharge Instructions (Signed)

## 2017-08-19 LAB — HIV ANTIBODY (ROUTINE TESTING W REFLEX): HIV SCREEN 4TH GENERATION: NONREACTIVE

## 2018-10-28 ENCOUNTER — Other Ambulatory Visit: Payer: Self-pay | Admitting: Obstetrics & Gynecology

## 2018-10-28 DIAGNOSIS — Z1231 Encounter for screening mammogram for malignant neoplasm of breast: Secondary | ICD-10-CM

## 2018-12-10 ENCOUNTER — Ambulatory Visit
Admission: RE | Admit: 2018-12-10 | Discharge: 2018-12-10 | Disposition: A | Discharge status: Home / Self Care | Payer: 59 | Source: Ambulatory Visit | Attending: Obstetrics & Gynecology | Admitting: Obstetrics & Gynecology

## 2018-12-10 ENCOUNTER — Encounter: Payer: Self-pay | Admitting: Radiology

## 2018-12-10 DIAGNOSIS — Z1231 Encounter for screening mammogram for malignant neoplasm of breast: Secondary | ICD-10-CM | POA: Insufficient documentation

## 2018-12-16 ENCOUNTER — Other Ambulatory Visit: Payer: Self-pay | Admitting: Obstetrics & Gynecology

## 2018-12-16 DIAGNOSIS — R928 Other abnormal and inconclusive findings on diagnostic imaging of breast: Secondary | ICD-10-CM

## 2018-12-16 DIAGNOSIS — N6489 Other specified disorders of breast: Secondary | ICD-10-CM

## 2018-12-22 ENCOUNTER — Ambulatory Visit
Admission: RE | Admit: 2018-12-22 | Discharge: 2018-12-22 | Disposition: A | Discharge status: Home / Self Care | Payer: 59 | Source: Ambulatory Visit | Attending: Obstetrics & Gynecology | Admitting: Obstetrics & Gynecology

## 2018-12-22 DIAGNOSIS — N6489 Other specified disorders of breast: Secondary | ICD-10-CM | POA: Diagnosis present

## 2018-12-22 DIAGNOSIS — R928 Other abnormal and inconclusive findings on diagnostic imaging of breast: Secondary | ICD-10-CM | POA: Insufficient documentation

## 2019-11-03 ENCOUNTER — Other Ambulatory Visit: Payer: Self-pay | Admitting: Obstetrics & Gynecology

## 2019-11-03 DIAGNOSIS — Z1231 Encounter for screening mammogram for malignant neoplasm of breast: Secondary | ICD-10-CM

## 2019-12-13 ENCOUNTER — Other Ambulatory Visit: Payer: Self-pay

## 2019-12-13 ENCOUNTER — Ambulatory Visit
Admission: RE | Admit: 2019-12-13 | Discharge: 2019-12-13 | Disposition: A | Discharge status: Home / Self Care | Payer: 59 | Source: Ambulatory Visit | Attending: Obstetrics & Gynecology | Admitting: Obstetrics & Gynecology

## 2019-12-13 DIAGNOSIS — Z1231 Encounter for screening mammogram for malignant neoplasm of breast: Secondary | ICD-10-CM | POA: Diagnosis not present

## 2019-12-20 ENCOUNTER — Other Ambulatory Visit: Payer: Self-pay | Admitting: Obstetrics & Gynecology

## 2019-12-20 DIAGNOSIS — R928 Other abnormal and inconclusive findings on diagnostic imaging of breast: Secondary | ICD-10-CM

## 2019-12-20 DIAGNOSIS — N6310 Unspecified lump in the right breast, unspecified quadrant: Secondary | ICD-10-CM

## 2019-12-30 ENCOUNTER — Ambulatory Visit
Admission: RE | Admit: 2019-12-30 | Discharge: 2019-12-30 | Disposition: A | Discharge status: Home / Self Care | Payer: 59 | Source: Ambulatory Visit | Attending: Obstetrics & Gynecology | Admitting: Obstetrics & Gynecology

## 2019-12-30 ENCOUNTER — Other Ambulatory Visit: Payer: Self-pay

## 2019-12-30 DIAGNOSIS — N6310 Unspecified lump in the right breast, unspecified quadrant: Secondary | ICD-10-CM | POA: Insufficient documentation

## 2019-12-30 DIAGNOSIS — R928 Other abnormal and inconclusive findings on diagnostic imaging of breast: Secondary | ICD-10-CM

## 2020-10-22 IMAGING — MG MM DIGITAL DIAGNOSTIC UNILAT*L* W/ TOMO
6 series · 6 of 18 positions shown · non-contrast
Comparison: December 10, 2018 screening mammography

CLINICAL DATA: The patient was called back for a left breast
asymmetry.

EXAM:
DIGITAL DIAGNOSTIC LEFT MAMMOGRAM WITH TOMO
ULTRASOUND LEFT BREAST

[L MLO synth-2D]
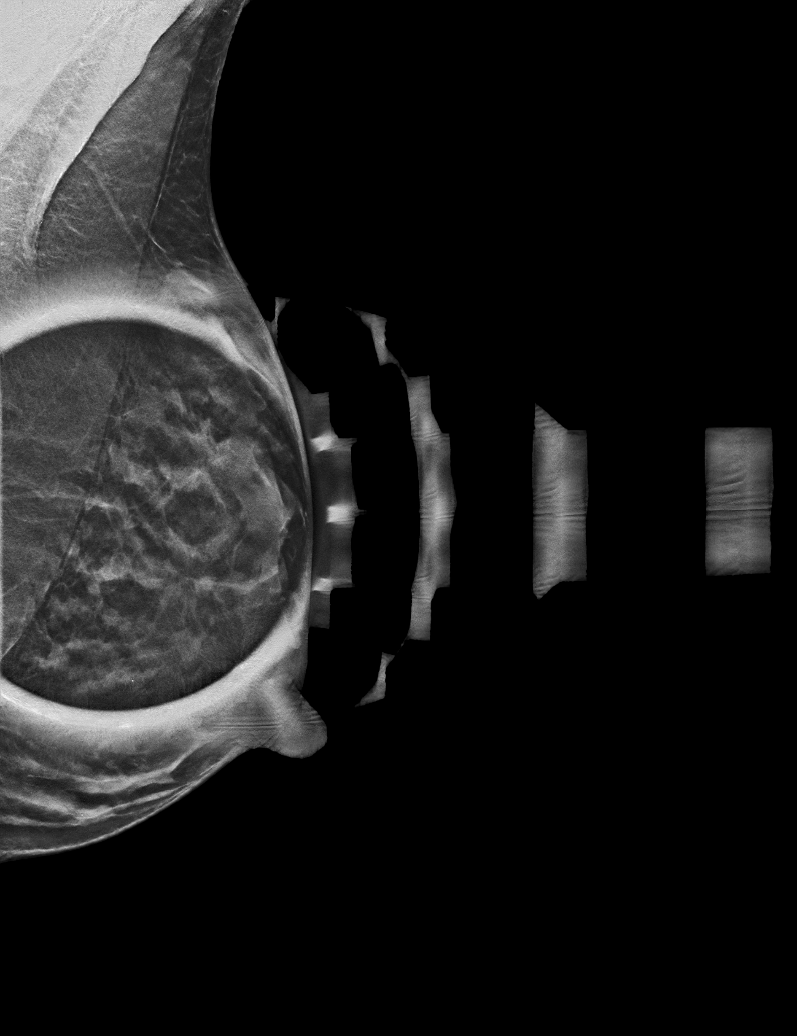

[L ML synth-2D]
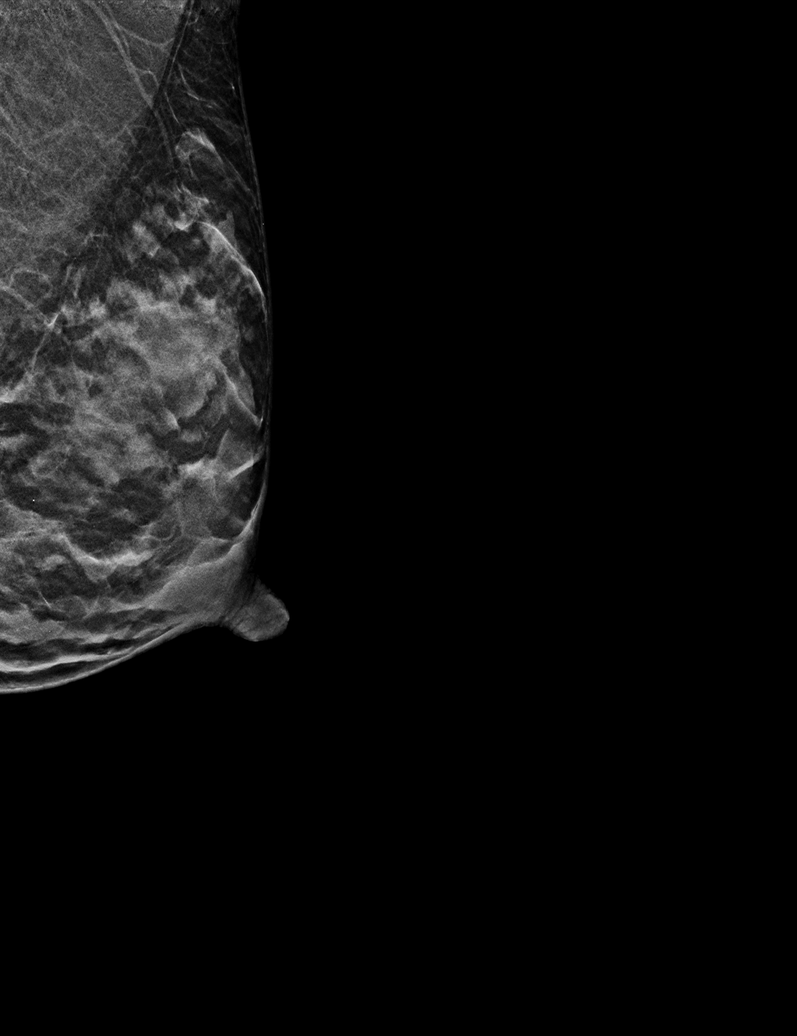

[L XCCL synth-2D]
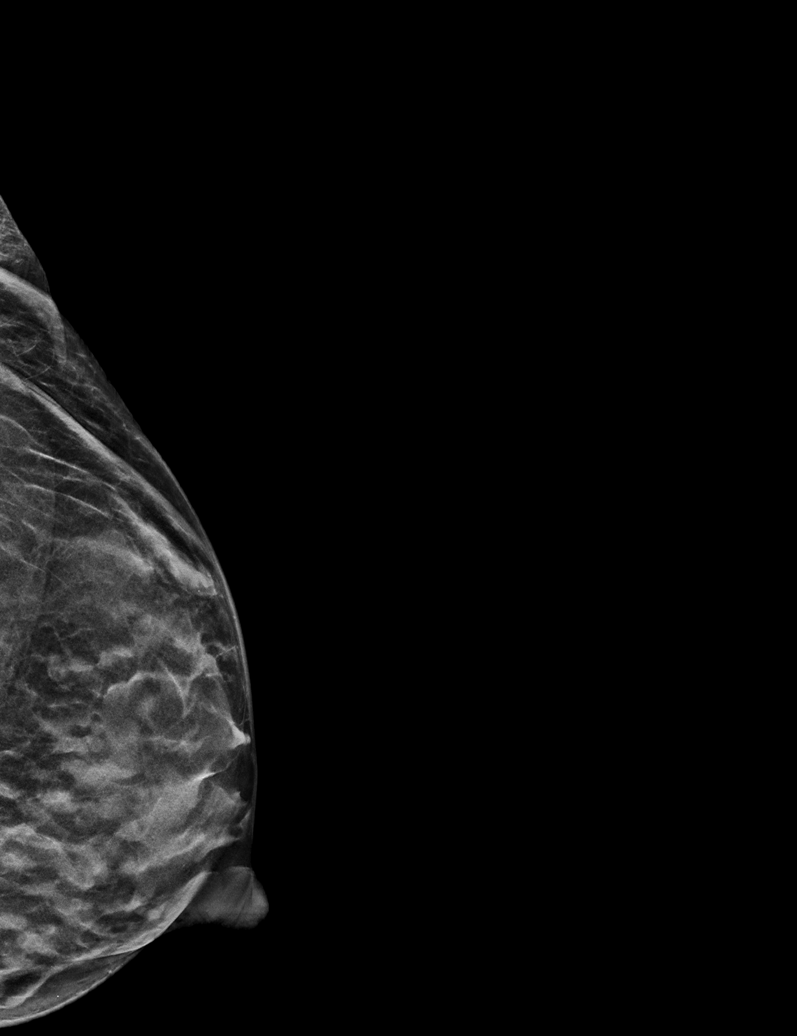

[L MLO tomo · tomo slice 17/34.0]
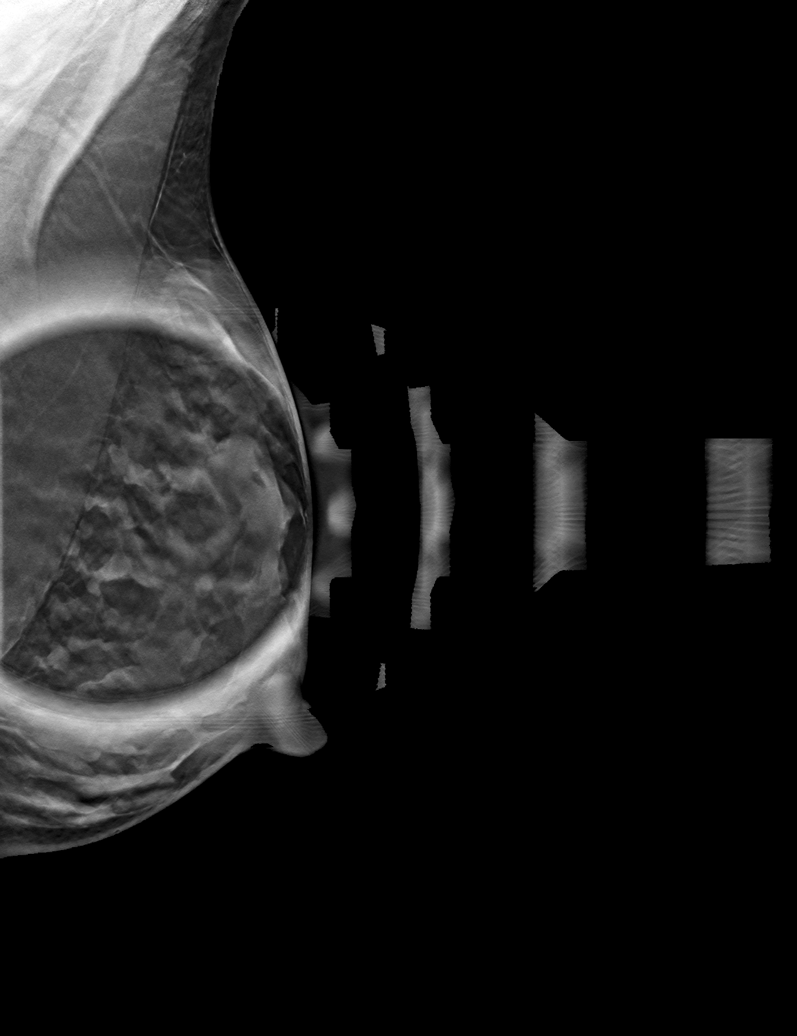

[L XCCL tomo · tomo slice 19/36.0]
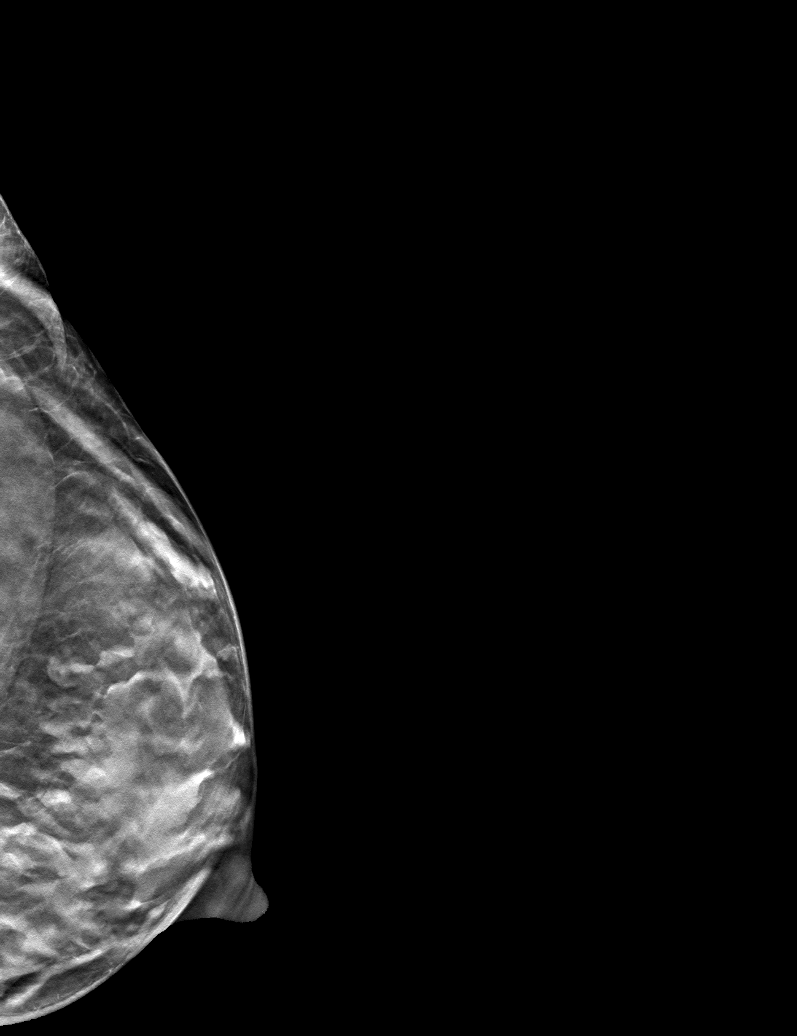

[L ML tomo · tomo slice 13/26.0]
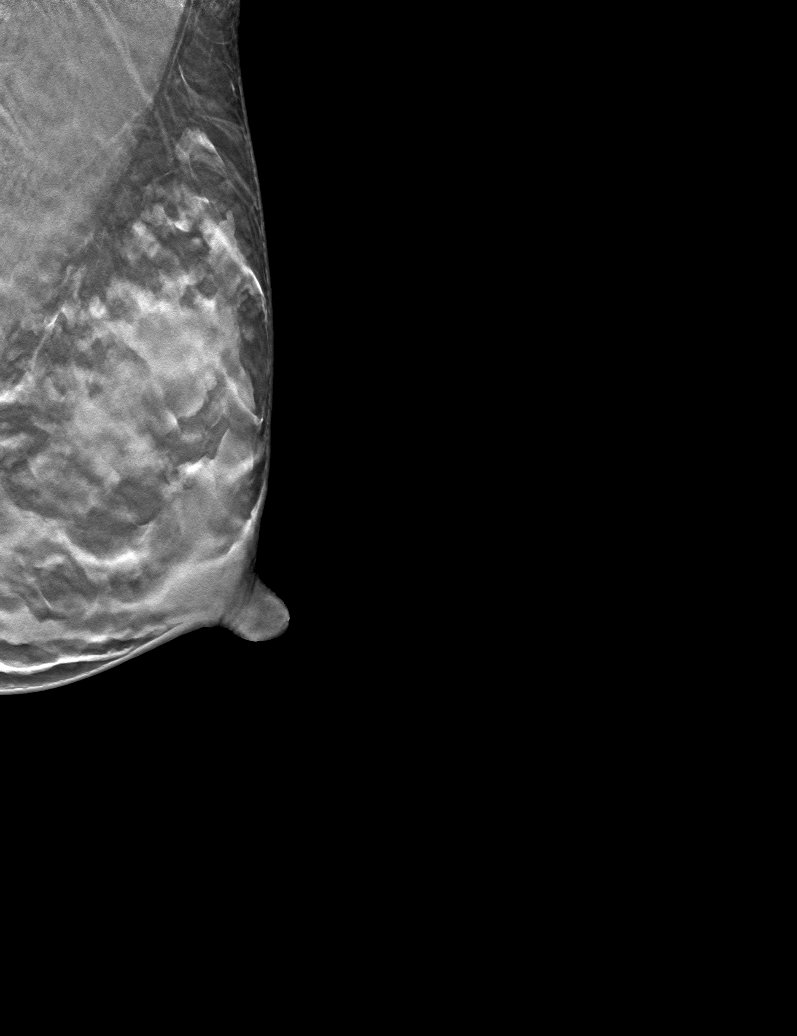

[6 of 18 positions shown; findings below may reference images not displayed]

ACR Breast Density Category c: The breast tissue is heterogeneously
dense, which may obscure small masses.
FINDINGS: The asymmetry resolves on the 90 degree lateral view and appears to
represent an island of dense glandular tissue on the spot MLO view.

On physical exam, no suspicious lumps are identified.

Targeted ultrasound is performed, showing no sonographic evidence of
malignancy.
IMPRESSION: No mammographic or sonographic evidence of malignancy.

RECOMMENDATION:
Annual screening mammography.

I have discussed the findings and recommendations with the patient.
If applicable, a reminder letter will be sent to the patient
regarding the next appointment.

BI-RADS CATEGORY  1: Negative.

## 2020-11-07 ENCOUNTER — Other Ambulatory Visit: Payer: Self-pay | Admitting: Obstetrics and Gynecology

## 2020-11-07 DIAGNOSIS — Z1231 Encounter for screening mammogram for malignant neoplasm of breast: Secondary | ICD-10-CM

## 2020-12-14 ENCOUNTER — Ambulatory Visit
Admission: RE | Admit: 2020-12-14 | Discharge: 2020-12-14 | Disposition: A | Discharge status: Home / Self Care | Payer: 59 | Source: Ambulatory Visit | Attending: Obstetrics and Gynecology | Admitting: Obstetrics and Gynecology

## 2020-12-14 ENCOUNTER — Other Ambulatory Visit: Payer: Self-pay

## 2020-12-14 DIAGNOSIS — Z1231 Encounter for screening mammogram for malignant neoplasm of breast: Secondary | ICD-10-CM | POA: Insufficient documentation

## 2021-10-30 IMAGING — US US BREAST*R* LIMITED INC AXILLA
1 series · 8 of 8 positions shown · non-contrast
Comparison: Previous exam(s).

CLINICAL DATA: 44-year-old female presenting as a recall from
screening for possible right breast mass.

EXAM:
DIGITAL DIAGNOSTIC RIGHT MAMMOGRAM WITH TOMO
ULTRASOUND RIGHT BREAST

[Series 1: us breast*right* limited inc axilla · 0.05mm/px · 8 of 8 slices shown]
[im 1/8]
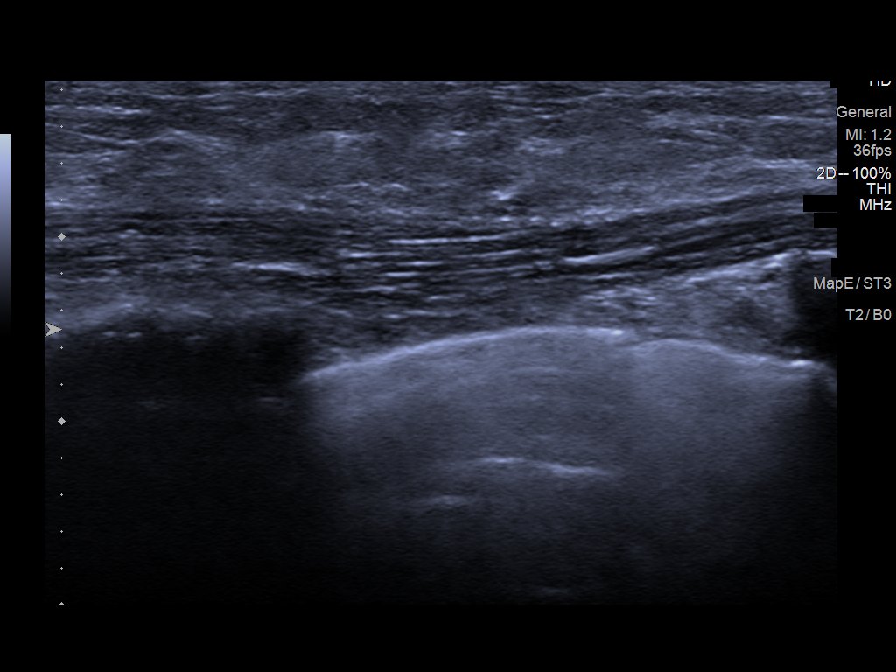
[im 2/8]
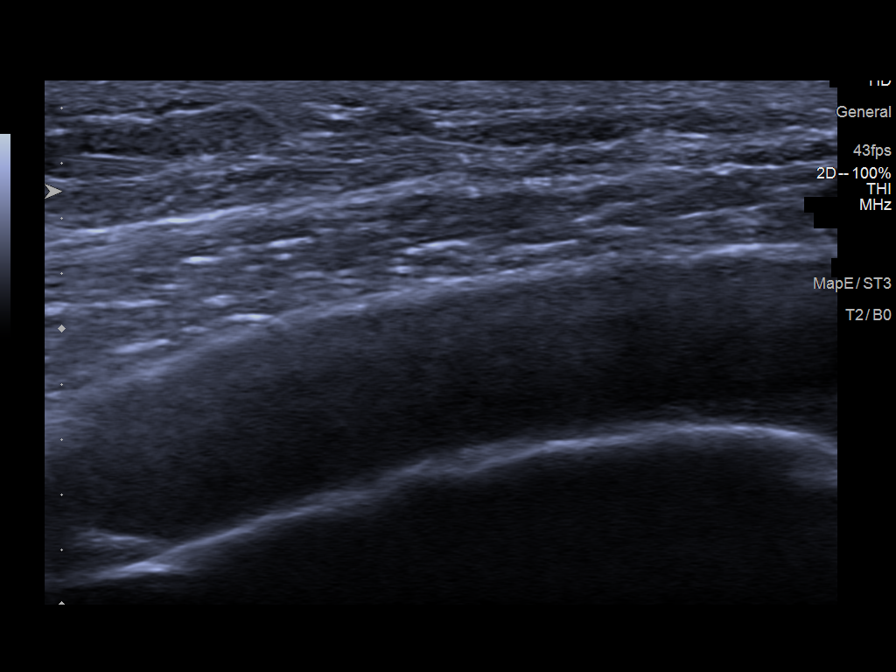
[im 3/8]
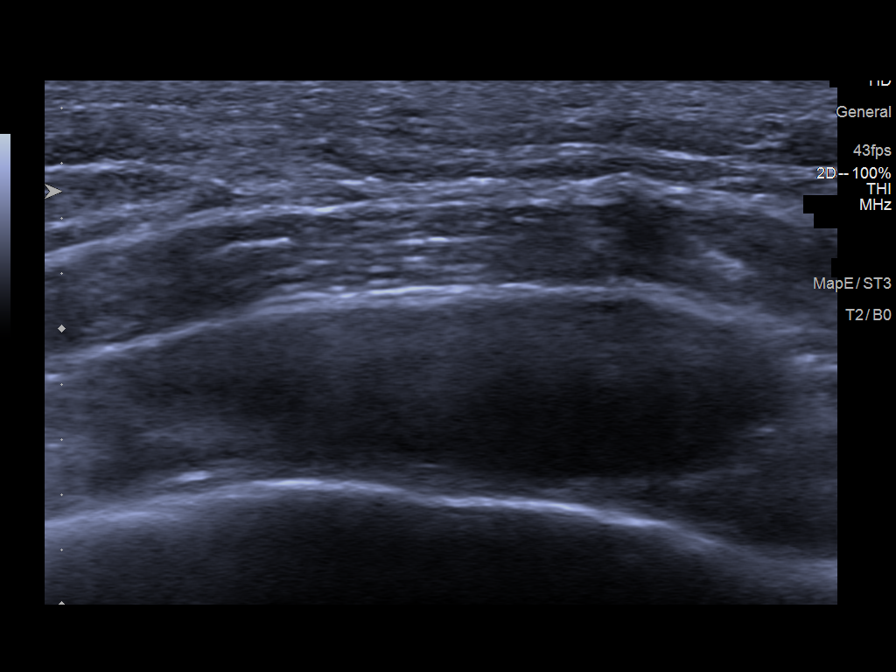
[im 4/8]
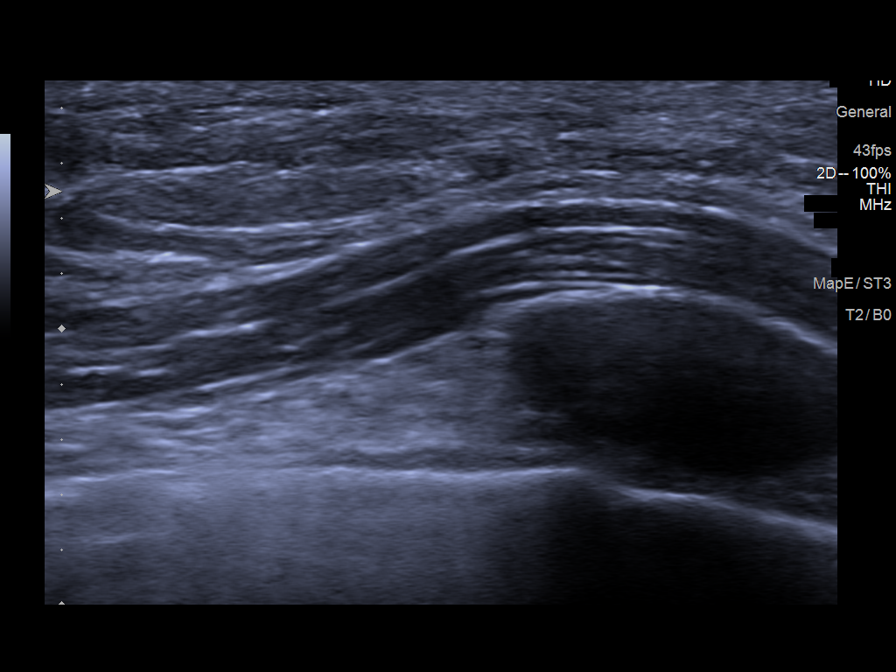
[im 5/8]
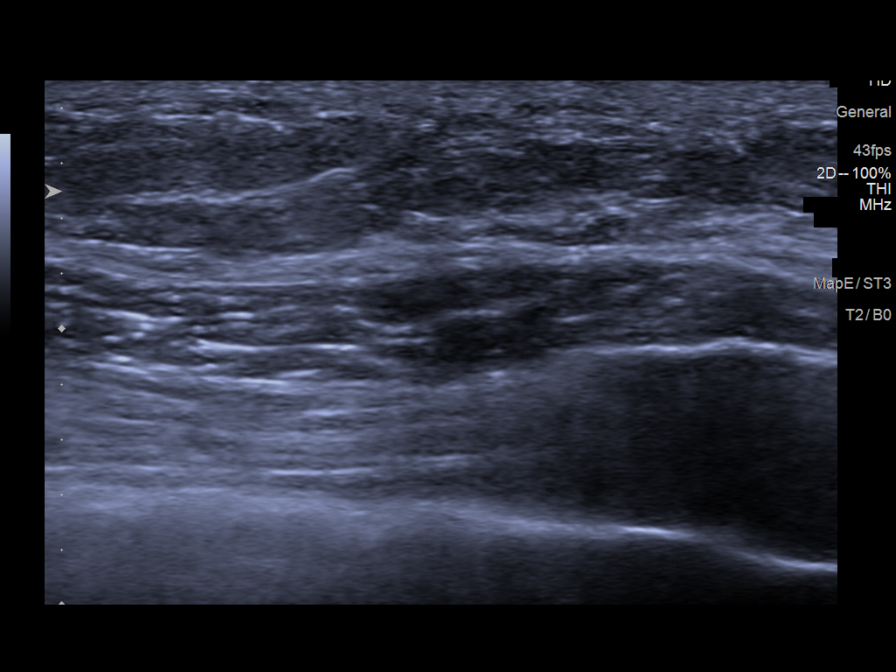
[im 6/8]
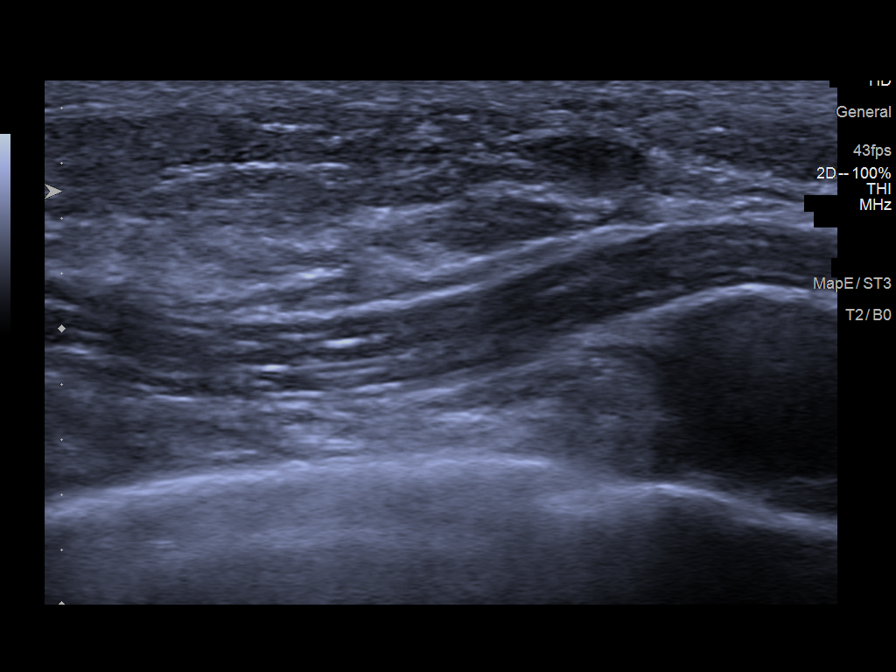
[im 7/8]
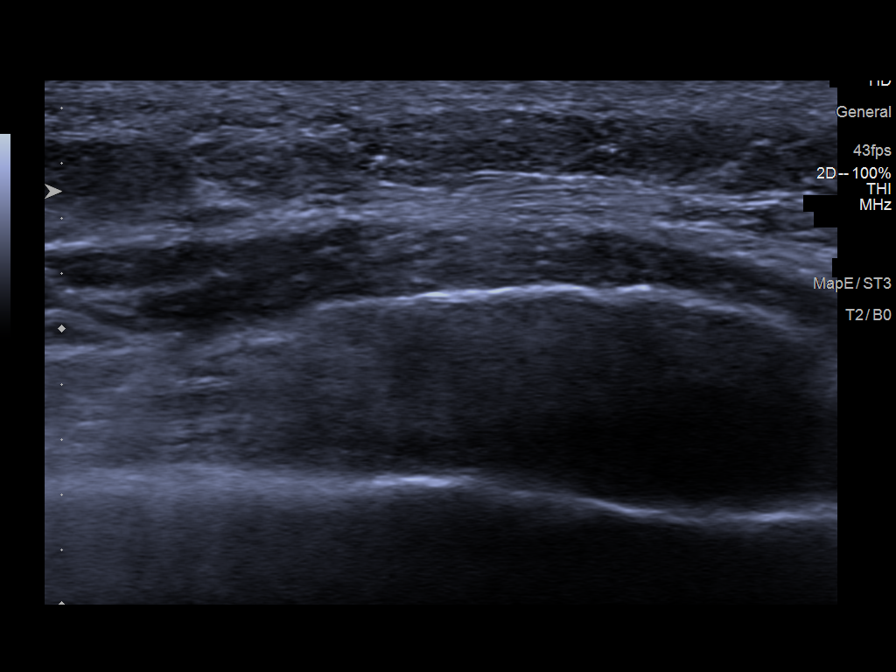
[im 8/8]
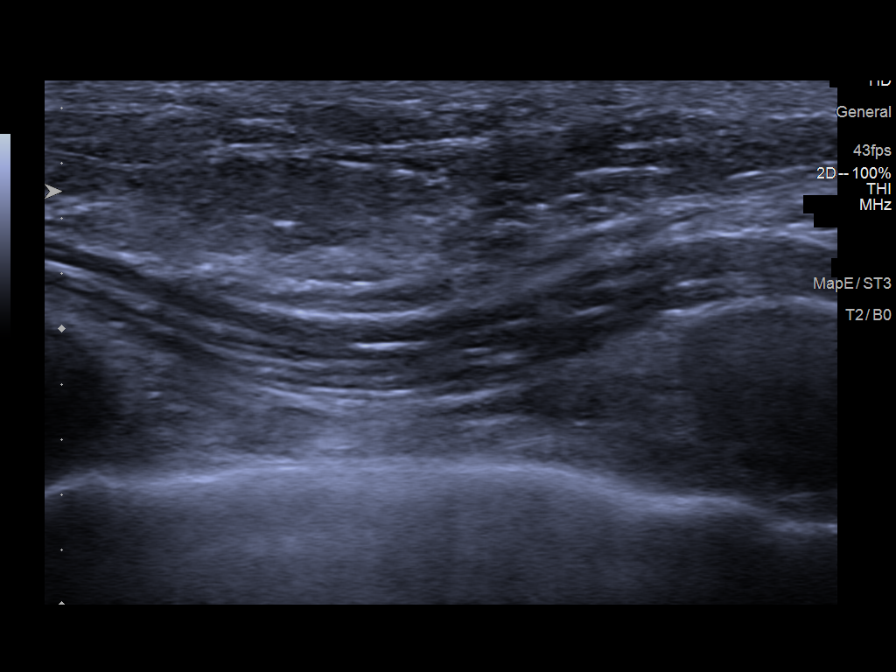

[8 of 8 positions shown; findings below may reference images not displayed]

ACR Breast Density Category d: The breast tissue is extremely dense,
which lowers the sensitivity of mammography.
FINDINGS: Mammogram:

Spot compression cc and full field mL tomosynthesis views of the
right breast performed for the questioned mass seen only on the cc
view on screening mammogram in the medial breast. On the additional
imaging the tissue in this area disperses without persistent
asymmetry, mass or distortion. There are no new findings in the
right breast.

On physical exam, I do not palpate a discrete mass in the medial
right breast.

Ultrasound:

Targeted ultrasound is performed throughout the medial aspect of the
right breast demonstrating no cystic or solid mass.
IMPRESSION: No mammographic or sonographic evidence of malignancy in the medial
right breast.

RECOMMENDATION:
Screening mammogram in one year.(Code:CR-1-HD8)

I have discussed the findings and recommendations with the patient.
If applicable, a reminder letter will be sent to the patient
regarding the next appointment.

BI-RADS CATEGORY  1: Negative.

## 2022-01-30 ENCOUNTER — Other Ambulatory Visit: Payer: Self-pay | Admitting: Obstetrics and Gynecology

## 2022-01-30 DIAGNOSIS — Z1231 Encounter for screening mammogram for malignant neoplasm of breast: Secondary | ICD-10-CM

## 2022-02-24 ENCOUNTER — Emergency Department
Admission: EM | Admit: 2022-02-24 | Discharge: 2022-02-24 | Disposition: A | Discharge status: Home / Self Care | Payer: 59 | Attending: Emergency Medicine | Admitting: Emergency Medicine

## 2022-02-24 ENCOUNTER — Other Ambulatory Visit: Payer: Self-pay

## 2022-02-24 ENCOUNTER — Emergency Department: Payer: 59

## 2022-02-24 DIAGNOSIS — Z1152 Encounter for screening for COVID-19: Secondary | ICD-10-CM | POA: Diagnosis not present

## 2022-02-24 DIAGNOSIS — N390 Urinary tract infection, site not specified: Secondary | ICD-10-CM | POA: Diagnosis not present

## 2022-02-24 DIAGNOSIS — R109 Unspecified abdominal pain: Secondary | ICD-10-CM | POA: Diagnosis present

## 2022-02-24 DIAGNOSIS — N12 Tubulo-interstitial nephritis, not specified as acute or chronic: Secondary | ICD-10-CM | POA: Diagnosis not present

## 2022-02-24 LAB — COMPREHENSIVE METABOLIC PANEL
ALT: 15 U/L (ref 0–44)
AST: 20 U/L (ref 15–41)
Albumin: 3.8 g/dL (ref 3.5–5.0)
Alkaline Phosphatase: 52 U/L (ref 38–126)
Anion gap: 5 (ref 5–15)
BUN: 17 mg/dL (ref 6–20)
CO2: 26 mmol/L (ref 22–32)
Calcium: 8.9 mg/dL (ref 8.9–10.3)
Chloride: 103 mmol/L (ref 98–111)
Creatinine, Ser: 0.79 mg/dL (ref 0.44–1.00)
GFR, Estimated: >60 mL/min (ref >60)
Glucose, Bld: 135 mg/dL — ABNORMAL HIGH (ref 70–99)
Potassium: 3.6 mmol/L (ref 3.5–5.1)
Sodium: 134 mmol/L — ABNORMAL LOW (ref 135–145)
Total Bilirubin: 1 mg/dL (ref 0.3–1.2)
Total Protein: 7.3 g/dL (ref 6.5–8.1)

## 2022-02-24 LAB — CBC
HCT: 39.7 % (ref 36.0–46.0)
Hemoglobin: 13.3 g/dL (ref 12.0–15.0)
MCH: 30.2 pg (ref 26.0–34.0)
MCHC: 33.5 g/dL (ref 30.0–36.0)
MCV: 90.2 fL (ref 80.0–100.0)
Platelets: 172 10*3/uL (ref 150–400)
RBC: 4.4 MIL/uL (ref 3.87–5.11)
RDW: 12.1 % (ref 11.5–15.5)
WBC: 15.9 10*3/uL — ABNORMAL HIGH (ref 4.0–10.5)
nRBC: 0 % (ref 0.0–0.2)

## 2022-02-24 LAB — URINALYSIS, ROUTINE W REFLEX MICROSCOPIC
Bilirubin Urine: NEGATIVE
Glucose, UA: NEGATIVE mg/dL
Ketones, ur: 20 mg/dL — AB
Nitrite: POSITIVE — AB
Protein, ur: >=300 mg/dL — AB
RBC / HPF: >50 RBC/hpf — ABNORMAL HIGH (ref 0–5)
Specific Gravity, Urine: 1.016 (ref 1.005–1.030)
Squamous Epithelial / HPF: NONE SEEN /HPF (ref 0–5)
WBC, UA: >50 WBC/hpf — ABNORMAL HIGH (ref 0–5)
pH: 6 (ref 5.0–8.0)

## 2022-02-24 LAB — RESP PANEL BY RT-PCR (RSV, FLU A&B, COVID)  RVPGX2
Influenza A by PCR: NEGATIVE
Influenza B by PCR: NEGATIVE
Resp Syncytial Virus by PCR: NEGATIVE
SARS Coronavirus 2 by RT PCR: NEGATIVE

## 2022-02-24 LAB — LIPASE, BLOOD: Lipase: 27 U/L (ref 11–51)

## 2022-02-24 LAB — POC URINE PREG, ED: Preg Test, Ur: NEGATIVE

## 2022-02-24 MED ORDER — KETOROLAC TROMETHAMINE 30 MG/ML IJ SOLN
30.0000 mg | Freq: Once | INTRAMUSCULAR | Status: AC
  Administered 2022-02-24: 30 mg via INTRAVENOUS
  Filled 2022-02-24: qty 1

## 2022-02-24 MED ORDER — SODIUM CHLORIDE 0.9 % IV SOLN
1.0000 g | Freq: Once | INTRAVENOUS | Status: AC
  Administered 2022-02-24: 1 g via INTRAVENOUS
  Filled 2022-02-24: qty 10

## 2022-02-24 MED ORDER — SODIUM CHLORIDE 0.9 % IV BOLUS
1000.0000 mL | Freq: Once | INTRAVENOUS | Status: AC
  Administered 2022-02-24: 1000 mL via INTRAVENOUS

## 2022-02-24 MED ORDER — CEPHALEXIN 500 MG PO CAPS: 500.0000 mg | ORAL_CAPSULE | Freq: Three times a day (TID) | ORAL | 0 refills | Status: DC

## 2022-02-24 MED ORDER — ONDANSETRON HCL 4 MG/2ML IJ SOLN
4.0000 mg | Freq: Once | INTRAMUSCULAR | Status: AC
  Administered 2022-02-24: 4 mg via INTRAVENOUS
  Filled 2022-02-24: qty 2

## 2022-02-24 MED ORDER — ONDANSETRON 4 MG PO TBDP: 4.0000 mg | ORAL_TABLET | Freq: Three times a day (TID) | ORAL | 0 refills | Status: AC | PRN

## 2022-02-24 MED ORDER — CEPHALEXIN 500 MG PO CAPS: 500.0000 mg | ORAL_CAPSULE | Freq: Three times a day (TID) | ORAL | 0 refills | Status: AC

## 2022-02-24 MED ORDER — SODIUM CHLORIDE 0.9 % IV SOLN: Freq: Once | INTRAVENOUS | Status: AC

## 2022-02-24 MED ORDER — KETOROLAC TROMETHAMINE 10 MG PO TABS: 10.0000 mg | ORAL_TABLET | Freq: Four times a day (QID) | ORAL | 0 refills | Status: AC | PRN

## 2022-02-24 MED ORDER — ONDANSETRON 4 MG PO TBDP: 4.0000 mg | ORAL_TABLET | Freq: Once | ORAL | Status: DC | PRN

## 2022-02-24 NOTE — ED Notes (Addendum)
Ceftriaxone was paused, IV was flushed prior and after Zofran was given. Ceftriaxone was then restarted.

## 2022-02-24 NOTE — ED Provider Notes (Signed)
[facility] Provider Note    Event Date/Time   First MD Initiated Contact with Patient 02/24/22 1556     (approximate)  History   Chief Complaint: Flank Pain and Emesis  HPI  Lindsay Fitzgerald is a 47 y.o. female with no significant past medical history presents to the emergency department for left flank pain.  According to the patient for the past 3 days she has been experiencing pain in the left flank and back.  Denies any dysuria but states urinary frequency and urgency.  She states today she also thought she saw some blood in her urine.  No history of kidney stones.  Vital signs are reassuring in the emergency department including afebrile.  Physical Exam   Triage Vital Signs: ED Triage Vitals  Enc Vitals Group     BP 02/24/22 1316 101/63     Pulse Rate 02/24/22 1316 95     Resp 02/24/22 1316 16     Temp 02/24/22 1316 98.1 F (36.7 C)     Temp Source 02/24/22 1316 Oral     SpO2 02/24/22 1316 100 %     Weight 02/24/22 1328 90 lb (40.8 kg)     Height 02/24/22 1328 4\' 11"  (1.499 m)     Head Circumference --      Peak Flow --      Pain Score 02/24/22 1328 8     Pain Loc --      Pain Edu? --      Excl. in [place]? --     Most recent vital signs: Vitals:   02/24/22 1316  BP: 101/63  Pulse: 95  Resp: 16  Temp: 98.1 F (36.7 C)  SpO2: 100%    General: Awake, no distress.  CV:  Good peripheral perfusion.  Regular rate and rhythm  Resp:  Normal effort.  Equal breath sounds bilaterally.  Abd:  No distention.  Soft, mild suprapubic tenderness.  Mild left CVA tenderness.    ED Results / Procedures / Treatments   RADIOLOGY  I have reviewed and interpreted the CT images.  No obvious ureteral stone or bowel obstruction seen on my evaluation.   MEDICATIONS ORDERED IN ED: Medications  cefTRIAXone (ROCEPHIN) 1 g in sodium chloride 0.9 % 100 mL IVPB (1 g Intravenous New Bag/Given 02/24/22 1618)  ondansetron (ZOFRAN) injection 4 mg (4 mg Intravenous Given  02/24/22 1343)  0.9 %  sodium chloride infusion (0 mLs Intravenous Stopped 02/24/22 1614)  ketorolac (TORADOL) 30 MG/ML injection 30 mg (30 mg Intravenous Given 02/24/22 1615)     IMPRESSION / MDM / ASSESSMENT AND PLAN / ED COURSE  I reviewed the triage vital signs and the nursing notes.  Patient's presentation is most consistent with acute presentation with potential threat to life or bodily function.  Patient presents to the emergency department for left flank pain.  Patient's labs have resulted showing significant urinary tract infection with greater than 50 red cells white cells nitrite positive.  Patient has leukocytosis of 15,000 otherwise reassuring CBC, overall reassuring chemistry with normal renal function, negative COVID/flu/RSV.  CT scan shows some dilation of the left collecting system which could possibly indicate a recently passed ureteral stone given nonobstructing renal stones present in both kidneys.  Differential still includes recently passed stone with urinary tract infection versus pyelonephritis.  We will dose Toradol Zofran IV fluids.  We will dose IV Rocephin and send urine culture.  Overall patient appears well anticipate she will likely be able to  be discharged after antibiotics with continued home antibiotics.  Patient is feeling better after medications.  We will plan to discharge with continued antibiotics.  Discussed return precautions with the patient.  FINAL CLINICAL IMPRESSION(S) / ED DIAGNOSES   Urinary tract infection Kidney stones Pyelonephritis  Note:  This document was prepared using Dragon voice recognition software and may include unintentional dictation errors.   Harvest Dark, MD 02/24/22 1659

## 2022-02-24 NOTE — Discharge Instructions (Addendum)
Please take antibiotics as prescribed for the entire course.  Please follow-up with your doctor within the next several days for recheck/reevaluation.  You have any worsening pain or recurrence of pain please follow-up with urology at the number provided.  Return to the emergency department if you are unable to keep down your antibiotics due to vomiting, develop a high fever, or any other symptom personally concerning to yourself.

## 2022-02-24 NOTE — ED Triage Notes (Signed)
Vomiting, weakness and left flank pain x 3 days. Pt states no pain with urination but it feels like it is not fully empty after peeing. Also complains of lower abd pain

## 2022-02-24 NOTE — ED Provider Triage Note (Signed)
Emergency Medicine Provider Triage Evaluation Note  Lindsay Fitzgerald , a 47 y.o. female  was evaluated in triage.  Pt complains of left flank pain, dysuria earlier in the week, diarrhea, chills fever.  Review of Systems  Positive:  Negative:   Physical Exam  BP 101/63 (BP Location: Right Arm)   Pulse 95   Temp 98.1 F (36.7 C) (Oral)   Resp 16   Ht 4\' 11"  (1.499 m)   Wt 40.8 kg   LMP 12/26/2021 (Exact Date)   SpO2 100%   BMI 18.18 kg/m  Gen:   Awake, no distress   Resp:  Normal effort  MSK:   Moves extremities without difficulty  Other:    Medical Decision Making  Medically screening exam initiated at 1:36 PM.  Appropriate orders placed.  Lindsay Fitzgerald was informed that the remainder of the evaluation will be completed by another provider, this initial triage assessment does not replace that evaluation, and the importance of remaining in the ED until their evaluation is complete.  Suspect kidney stone or pyelo, iv started in triage, fluids zofran given labs, and ct ordered   Lindsay Starks, PA-C 02/24/22 1337

## 2022-02-27 LAB — URINE CULTURE: Culture: >=100000 — AB

## 2022-03-26 ENCOUNTER — Ambulatory Visit
Admission: RE | Admit: 2022-03-26 | Discharge: 2022-03-26 | Disposition: A | Discharge status: Home / Self Care | Payer: 59 | Source: Ambulatory Visit | Attending: Obstetrics and Gynecology | Admitting: Obstetrics and Gynecology

## 2022-03-26 DIAGNOSIS — Z1231 Encounter for screening mammogram for malignant neoplasm of breast: Secondary | ICD-10-CM | POA: Insufficient documentation

## 2022-05-26 ENCOUNTER — Telehealth: Payer: Self-pay

## 2022-05-26 NOTE — Telephone Encounter (Signed)
Patient left a voicemail stating she needs to move colonoscopy that is schedule in April.

## 2022-05-27 NOTE — Telephone Encounter (Signed)
Informed patient she talk to [facility] clinc gi about scheduling a appointment and she needed to call them
